# Patient Record
Sex: Female | Born: 1950 | Race: Black or African American | Hispanic: No | Marital: Married | Smoking: Former smoker
Health system: Southern US, Community
[De-identification: ages and names within clinical notes are randomized; demographics above are authoritative.]

## PROBLEM LIST (undated history)

## (undated) DIAGNOSIS — E039 Hypothyroidism, unspecified: Secondary | ICD-10-CM

## (undated) DIAGNOSIS — IMO0002 Reserved for concepts with insufficient information to code with codable children: Secondary | ICD-10-CM

## (undated) DIAGNOSIS — M329 Systemic lupus erythematosus, unspecified: Secondary | ICD-10-CM

## (undated) HISTORY — PX: OOPHORECTOMY: SHX86

## (undated) HISTORY — PX: PELVIC LAPAROSCOPY: SHX162

## (undated) HISTORY — PX: KNEE SURGERY: SHX244

## (undated) HISTORY — DX: Systemic lupus erythematosus, unspecified: M32.9

## (undated) HISTORY — DX: Hypothyroidism, unspecified: E03.9

## (undated) HISTORY — DX: Reserved for concepts with insufficient information to code with codable children: IMO0002

## (undated) HISTORY — PX: ABDOMINAL HYSTERECTOMY: SHX81

---

## 1999-01-28 ENCOUNTER — Encounter: Admission: RE | Admit: 1999-01-28 | Discharge: 1999-01-28 | Payer: Self-pay | Admitting: Gynecology

## 1999-01-28 ENCOUNTER — Encounter: Payer: Self-pay | Admitting: Gynecology

## 1999-02-04 ENCOUNTER — Encounter: Payer: Self-pay | Admitting: Gynecology

## 1999-02-04 ENCOUNTER — Encounter: Admission: RE | Admit: 1999-02-04 | Discharge: 1999-02-04 | Payer: Self-pay | Admitting: Gynecology

## 1999-02-28 ENCOUNTER — Other Ambulatory Visit: Admission: RE | Admit: 1999-02-28 | Discharge: 1999-02-28 | Payer: Self-pay | Admitting: Gynecology

## 1999-06-03 ENCOUNTER — Ambulatory Visit (HOSPITAL_COMMUNITY): Admission: RE | Admit: 1999-06-03 | Discharge: 1999-06-03 | Payer: Self-pay | Admitting: Internal Medicine

## 2000-02-05 ENCOUNTER — Encounter: Admission: RE | Admit: 2000-02-05 | Discharge: 2000-02-05 | Payer: Self-pay | Admitting: Gynecology

## 2000-02-05 ENCOUNTER — Encounter: Payer: Self-pay | Admitting: Gynecology

## 2000-02-05 ENCOUNTER — Other Ambulatory Visit: Admission: RE | Admit: 2000-02-05 | Discharge: 2000-02-05 | Payer: Self-pay | Admitting: Gynecology

## 2001-01-10 ENCOUNTER — Other Ambulatory Visit: Admission: RE | Admit: 2001-01-10 | Discharge: 2001-01-10 | Payer: Self-pay | Admitting: Gynecology

## 2001-02-09 ENCOUNTER — Encounter: Payer: Self-pay | Admitting: Gynecology

## 2001-02-09 ENCOUNTER — Encounter: Admission: RE | Admit: 2001-02-09 | Discharge: 2001-02-09 | Payer: Self-pay | Admitting: Gynecology

## 2002-01-11 ENCOUNTER — Other Ambulatory Visit: Admission: RE | Admit: 2002-01-11 | Discharge: 2002-01-11 | Payer: Self-pay | Admitting: Gynecology

## 2002-02-16 ENCOUNTER — Encounter: Payer: Self-pay | Admitting: Gynecology

## 2002-02-16 ENCOUNTER — Encounter: Admission: RE | Admit: 2002-02-16 | Discharge: 2002-02-16 | Payer: Self-pay | Admitting: Gynecology

## 2002-04-05 ENCOUNTER — Ambulatory Visit (HOSPITAL_COMMUNITY): Admission: RE | Admit: 2002-04-05 | Discharge: 2002-04-05 | Payer: Self-pay | Admitting: Internal Medicine

## 2002-09-23 ENCOUNTER — Emergency Department (HOSPITAL_COMMUNITY): Admission: EM | Admit: 2002-09-23 | Discharge: 2002-09-24 | Payer: Self-pay

## 2003-01-31 ENCOUNTER — Other Ambulatory Visit: Admission: RE | Admit: 2003-01-31 | Discharge: 2003-01-31 | Payer: Self-pay | Admitting: Gynecology

## 2003-02-07 ENCOUNTER — Encounter: Admission: RE | Admit: 2003-02-07 | Discharge: 2003-02-07 | Payer: Self-pay | Admitting: Gynecology

## 2003-06-14 ENCOUNTER — Encounter: Admission: RE | Admit: 2003-06-14 | Discharge: 2003-06-14 | Payer: Self-pay | Admitting: Internal Medicine

## 2004-02-06 ENCOUNTER — Other Ambulatory Visit: Admission: RE | Admit: 2004-02-06 | Discharge: 2004-02-06 | Payer: Self-pay | Admitting: Gynecology

## 2004-04-22 ENCOUNTER — Encounter: Admission: RE | Admit: 2004-04-22 | Discharge: 2004-04-22 | Payer: Self-pay | Admitting: Gynecology

## 2005-02-02 ENCOUNTER — Other Ambulatory Visit: Admission: RE | Admit: 2005-02-02 | Discharge: 2005-02-02 | Payer: Self-pay | Admitting: Gynecology

## 2005-04-24 ENCOUNTER — Encounter: Admission: RE | Admit: 2005-04-24 | Discharge: 2005-04-24 | Payer: Self-pay | Admitting: Gynecology

## 2005-08-28 ENCOUNTER — Encounter: Admission: RE | Admit: 2005-08-28 | Discharge: 2005-08-28 | Payer: Self-pay | Admitting: Internal Medicine

## 2006-02-16 ENCOUNTER — Other Ambulatory Visit: Admission: RE | Admit: 2006-02-16 | Discharge: 2006-02-16 | Payer: Self-pay | Admitting: Gynecology

## 2006-05-14 ENCOUNTER — Encounter: Admission: RE | Admit: 2006-05-14 | Discharge: 2006-05-14 | Payer: Self-pay | Admitting: Gynecology

## 2007-05-16 ENCOUNTER — Encounter: Admission: RE | Admit: 2007-05-16 | Discharge: 2007-05-16 | Payer: Self-pay | Admitting: Gynecology

## 2007-07-07 ENCOUNTER — Emergency Department (HOSPITAL_COMMUNITY): Admission: EM | Admit: 2007-07-07 | Discharge: 2007-07-08 | Payer: Self-pay | Admitting: Emergency Medicine

## 2007-07-11 ENCOUNTER — Ambulatory Visit (HOSPITAL_COMMUNITY): Admission: RE | Admit: 2007-07-11 | Discharge: 2007-07-11 | Payer: Self-pay | Admitting: Neurology

## 2007-08-19 ENCOUNTER — Emergency Department: Payer: Self-pay | Admitting: Emergency Medicine

## 2007-09-01 ENCOUNTER — Ambulatory Visit: Payer: Self-pay | Admitting: Specialist

## 2008-01-18 ENCOUNTER — Ambulatory Visit: Payer: Self-pay | Admitting: Specialist

## 2008-05-16 ENCOUNTER — Encounter: Admission: RE | Admit: 2008-05-16 | Discharge: 2008-05-16 | Payer: Self-pay | Admitting: Gynecology

## 2009-03-04 ENCOUNTER — Ambulatory Visit: Payer: Self-pay | Admitting: General Practice

## 2009-05-21 ENCOUNTER — Encounter: Admission: RE | Admit: 2009-05-21 | Discharge: 2009-05-21 | Payer: Self-pay | Admitting: Gynecology

## 2010-06-09 ENCOUNTER — Other Ambulatory Visit: Payer: Self-pay | Admitting: Gynecology

## 2010-06-09 DIAGNOSIS — Z1231 Encounter for screening mammogram for malignant neoplasm of breast: Secondary | ICD-10-CM

## 2010-06-17 ENCOUNTER — Ambulatory Visit: Payer: Self-pay

## 2010-07-08 ENCOUNTER — Ambulatory Visit
Admission: RE | Admit: 2010-07-08 | Discharge: 2010-07-08 | Disposition: A | Discharge status: Home / Self Care | Payer: 59 | Source: Ambulatory Visit | Attending: Gynecology | Admitting: Gynecology

## 2010-07-08 DIAGNOSIS — Z1231 Encounter for screening mammogram for malignant neoplasm of breast: Secondary | ICD-10-CM

## 2010-07-24 ENCOUNTER — Emergency Department (HOSPITAL_COMMUNITY)
Admission: EM | Admit: 2010-07-24 | Discharge: 2010-07-25 | Disposition: A | Discharge status: Home / Self Care | Payer: 59 | Attending: Emergency Medicine | Admitting: Emergency Medicine

## 2010-07-24 DIAGNOSIS — E039 Hypothyroidism, unspecified: Secondary | ICD-10-CM | POA: Insufficient documentation

## 2010-07-24 DIAGNOSIS — K089 Disorder of teeth and supporting structures, unspecified: Secondary | ICD-10-CM | POA: Insufficient documentation

## 2010-07-24 DIAGNOSIS — K047 Periapical abscess without sinus: Secondary | ICD-10-CM | POA: Insufficient documentation

## 2010-10-23 LAB — POCT I-STAT, CHEM 8
BUN: 37 — ABNORMAL HIGH
Calcium, Ion: 1.28
Chloride: 105
Creatinine, Ser: 1.4 — ABNORMAL HIGH
Hemoglobin: 14.6
Potassium: 4
TCO2: 26

## 2011-02-26 LAB — LIPID PANEL
HDL: 74 mg/dL — AB (ref 35–70)
LDL Cholesterol: 149 mg/dL
LDl/HDL Ratio: 3.1

## 2011-02-26 LAB — HEPATIC FUNCTION PANEL
AST: 96 U/L — AB (ref 13–35)
Bilirubin, Total: 0.4 mg/dL

## 2011-02-26 LAB — BASIC METABOLIC PANEL: Glucose: 75 mg/dL

## 2011-02-26 LAB — CBC AND DIFFERENTIAL
HCT: 37 % (ref 36–46)
Hemoglobin: 12 g/dL (ref 12.0–16.0)

## 2011-08-17 ENCOUNTER — Other Ambulatory Visit: Payer: Self-pay | Admitting: Gynecology

## 2011-08-17 DIAGNOSIS — Z1231 Encounter for screening mammogram for malignant neoplasm of breast: Secondary | ICD-10-CM

## 2011-08-21 ENCOUNTER — Ambulatory Visit
Admission: RE | Admit: 2011-08-21 | Discharge: 2011-08-21 | Disposition: A | Discharge status: Home / Self Care | Payer: 59 | Source: Ambulatory Visit | Attending: Internal Medicine | Admitting: Internal Medicine

## 2011-08-21 ENCOUNTER — Other Ambulatory Visit: Payer: Self-pay | Admitting: Internal Medicine

## 2011-08-21 DIAGNOSIS — R109 Unspecified abdominal pain: Secondary | ICD-10-CM

## 2011-09-01 ENCOUNTER — Ambulatory Visit: Payer: 59

## 2011-10-01 ENCOUNTER — Ambulatory Visit: Payer: 59

## 2011-10-20 ENCOUNTER — Ambulatory Visit
Admission: RE | Admit: 2011-10-20 | Discharge: 2011-10-20 | Disposition: A | Discharge status: Home / Self Care | Payer: 59 | Source: Ambulatory Visit | Attending: Gynecology | Admitting: Gynecology

## 2011-10-20 DIAGNOSIS — Z1231 Encounter for screening mammogram for malignant neoplasm of breast: Secondary | ICD-10-CM

## 2011-10-22 ENCOUNTER — Other Ambulatory Visit: Payer: Self-pay | Admitting: Gynecology

## 2011-10-22 DIAGNOSIS — R928 Other abnormal and inconclusive findings on diagnostic imaging of breast: Secondary | ICD-10-CM

## 2011-10-27 ENCOUNTER — Ambulatory Visit
Admission: RE | Admit: 2011-10-27 | Discharge: 2011-10-27 | Disposition: A | Discharge status: Home / Self Care | Payer: 59 | Source: Ambulatory Visit | Attending: Gynecology | Admitting: Gynecology

## 2011-10-27 DIAGNOSIS — R928 Other abnormal and inconclusive findings on diagnostic imaging of breast: Secondary | ICD-10-CM

## 2012-05-05 ENCOUNTER — Encounter: Payer: Self-pay | Admitting: General Practice

## 2012-08-11 ENCOUNTER — Encounter: Payer: Self-pay | Admitting: Gynecology

## 2012-08-11 ENCOUNTER — Ambulatory Visit (INDEPENDENT_AMBULATORY_CARE_PROVIDER_SITE_OTHER): Payer: 59 | Admitting: Gynecology

## 2012-08-11 VITALS — BP 112/64 | Ht 63.0 in | Wt 154.0 lb

## 2012-08-11 DIAGNOSIS — L989 Disorder of the skin and subcutaneous tissue, unspecified: Secondary | ICD-10-CM

## 2012-08-11 DIAGNOSIS — Z01419 Encounter for gynecological examination (general) (routine) without abnormal findings: Secondary | ICD-10-CM

## 2012-08-11 DIAGNOSIS — Z780 Asymptomatic menopausal state: Secondary | ICD-10-CM

## 2012-08-11 DIAGNOSIS — R1032 Left lower quadrant pain: Secondary | ICD-10-CM

## 2012-08-11 DIAGNOSIS — N951 Menopausal and female climacteric states: Secondary | ICD-10-CM

## 2012-08-11 MED ORDER — VALACYCLOVIR HCL 500 MG PO TABS: 500.0000 mg | ORAL_TABLET | Freq: Two times a day (BID) | ORAL | Status: DC

## 2012-08-11 NOTE — Progress Notes (Signed)
Isabel Lynn [birth year] [id number]        62 y.o.  [id number] for annual exam.  Former patient of Dr. Nicholas Lose with several issues noted below.  Past medical history,surgical history, medications, allergies, family history and social history were all reviewed and documented in the EPIC chart.  ROS:  Performed and pertinent positives and negatives are included in the history, assessment and plan .  Exam: Isabel Lynn assistant Filed Vitals:   08/11/12 1540  BP: 112/64  Height: 5\' 3"  (1.6 m)  Weight: 154 lb (69.854 kg)   General appearance  Normal Skin grossly crusting raised lesion left upper buttocks crease Head/Neck general alopecia with no cervical or supraclavicular adenopathy thyroid normal Lungs  clear Cardiac RR, without RMG Abdominal  soft, nontender, without masses, organomegaly. Left inguinal mild swelling with straining consistent with a small hernia. Breasts  examined lying and sitting without masses, retractions, discharge or axillary adenopathy. Pelvic  Ext/BUS/vagina  normal with atrophic changes   Adnexa  Without masses or tenderness    Anus and perineum  normal   Rectovaginal  normal sphincter tone without palpated masses or tenderness.    Assessment/Plan:  62 y.o. [id number] female for annual exam.   1. Menopausal symptoms. Patient had been on estrogen replacement therapy since her hysterectomy around age 62. She had a TAH/BSO for questionable reasons. Apparently she was having a lot of pain that ultimately led to her hysterectomy. It was not for abnormal bleeding or Pap smear abnormality issues.  She ran out several months ago and notes no real difference as far as hot flushes night sweats vaginal dryness or other symptoms. I reviewed the whole issue of HRT with her to include the WHI study with increased risk of stroke, heart attack, DVT and breast cancer. The ACOG and NAMS statements for lowest dose for the shortest period of time reviewed. Transdermal versus oral first-pass  effect benefit discussed.  She had been on the patch previously. At this point she's going to stay off of ERT and see how she does and as long as she continues well then she will stay off of hormone replacement. She started to develop worsening symptoms she'll represent for evaluation. 2. Suspected inguinal hernia. Complaining of intermittent left lower quadrant pain in the inguinal crease. On straining I can feel some bulging suspicious for hernia. She was a weight lifter which certainly would go along with a hernia formation. Recommend that she followup with Gen. surgery for evaluation and discussion about repair. It's unclear how much discomfort is really causing her and she may elect to monitor at present least she will hear what is involved with the surgery. 3. 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 Patient knows whenever she is stressed she gets a crusting lesion in the upper buttocks crease. She uses Neosporin and it usually resolves in 1 or so weeks. Exam today shows a crusting raised lesion suspicious for herpes. I did not do an HSV screening given her use of Neosporin recently. I did recommend a trial of Valtrex 500 mg twice a day for 10 days and then I asked her to come back in 2 weeks for reexamination. If there is any lingering raised area then I am going to biopsy this area. The importance of followup was stressed. 4. Mammography 09/2011. Patient had mammogram last September with a recommended additional views she never followed up for them. She is approaching a year mark now I recommended she call them and schedule a full mammography and  additional views as a pill appropriate but she really needs to followup for this and she agrees to do so. SBE monthly reviewed. 5. DEXA 15 years ago. I do not have a copy of this but she was not told it was abnormal. Recommend repeat now has baseline she agrees to arrange. Increase calcium vitamin D reviewed. 6. Colonoscopy 10 years ago. Due for colonoscopy  now she agrees to call her gastroenterologist to arrange. 7. Pap smear 2013. No Pap smear done today. No history of abnormal Pap smears previously. Status post hysterectomy for benign indications. Options to stop screening altogether or less frequent screening intervals reviewed and we'll readdress on an annual basis. 8. Health maintenance. Patient is bald and relates that it she had initial hair loss due to lupus and ultimately in shape the rest of her head. Apparently has no other manifestations by her history. She is being followed by Dr. Willey Blade and will continue to see him in followup. No blood work was done today as this is all done through Dr. Diamantina [facility] office. Followup in 2 weeks for reinspection of the buttocks area and possible biopsy if it persists. Again I stressed the need for followup with her.  Note: This document was prepared with digital dictation and possible smart phrase technology. Any transcriptional errors that result from this process are unintentional.   Dara Lords MD, 4:18 PM 08/11/2012

## 2012-08-11 NOTE — Patient Instructions (Addendum)
Followup for reexamination and possible skin biopsy in 2 weeks. Schedule bone density.

## 2012-08-12 ENCOUNTER — Telehealth: Payer: Self-pay | Admitting: *Deleted

## 2012-08-12 DIAGNOSIS — K4090 Unilateral inguinal hernia, without obstruction or gangrene, not specified as recurrent: Secondary | ICD-10-CM

## 2012-08-12 LAB — URINALYSIS W MICROSCOPIC + REFLEX CULTURE
Bilirubin Urine: NEGATIVE
Glucose, UA: NEGATIVE mg/dL
Protein, ur: NEGATIVE mg/dL
Specific Gravity, Urine: 1.021 (ref 1.005–1.030)
Urobilinogen, UA: 0.2 mg/dL (ref 0.0–1.0)

## 2012-08-12 NOTE — Telephone Encounter (Signed)
Appt. 08/23/12 @ 2:20 pm with Dr.Tusei left message for pt to call.

## 2012-08-12 NOTE — Telephone Encounter (Signed)
Message copied by Aura Camps on Fri Aug 12, 2012 11:23 AM ------      Message from: Dara Lords      Created: Thu Aug 11, 2012  5:08 PM       Schedule an appointment with Gen. surgery reference swelling and pain suspicious for left inguinal hernia. ------

## 2012-08-13 LAB — URINE CULTURE: Organism ID, Bacteria: NO GROWTH

## 2012-08-15 NOTE — Telephone Encounter (Signed)
Pt informed with the below note. 

## 2012-08-23 ENCOUNTER — Ambulatory Visit (INDEPENDENT_AMBULATORY_CARE_PROVIDER_SITE_OTHER): Payer: Self-pay | Admitting: Surgery

## 2012-08-25 ENCOUNTER — Ambulatory Visit: Payer: 59 | Admitting: Gynecology

## 2012-09-08 ENCOUNTER — Ambulatory Visit (INDEPENDENT_AMBULATORY_CARE_PROVIDER_SITE_OTHER): Payer: Self-pay | Admitting: Surgery

## 2012-09-08 ENCOUNTER — Ambulatory Visit (INDEPENDENT_AMBULATORY_CARE_PROVIDER_SITE_OTHER): Payer: 59 | Admitting: Surgery

## 2012-11-09 ENCOUNTER — Other Ambulatory Visit: Payer: Self-pay | Admitting: Gynecology

## 2012-11-09 DIAGNOSIS — N6001 Solitary cyst of right breast: Secondary | ICD-10-CM

## 2012-11-23 ENCOUNTER — Other Ambulatory Visit: Payer: 59

## 2012-12-07 ENCOUNTER — Ambulatory Visit
Admission: RE | Admit: 2012-12-07 | Discharge: 2012-12-07 | Disposition: A | Discharge status: Home / Self Care | Payer: 59 | Source: Ambulatory Visit | Attending: Gynecology | Admitting: Gynecology

## 2012-12-07 DIAGNOSIS — N6001 Solitary cyst of right breast: Secondary | ICD-10-CM

## 2013-08-24 ENCOUNTER — Ambulatory Visit (INDEPENDENT_AMBULATORY_CARE_PROVIDER_SITE_OTHER): Payer: 59 | Admitting: Gynecology

## 2013-08-24 ENCOUNTER — Encounter: Payer: Self-pay | Admitting: Gynecology

## 2013-08-24 VITALS — BP 122/76 | Ht 63.0 in | Wt 167.0 lb

## 2013-08-24 DIAGNOSIS — Z01419 Encounter for gynecological examination (general) (routine) without abnormal findings: Secondary | ICD-10-CM

## 2013-08-24 DIAGNOSIS — N952 Postmenopausal atrophic vaginitis: Secondary | ICD-10-CM

## 2013-08-24 DIAGNOSIS — Z780 Asymptomatic menopausal state: Secondary | ICD-10-CM

## 2013-08-24 NOTE — Patient Instructions (Signed)
Followup for bone density as scheduled.  Schedule colonoscopy.  You may obtain a copy of any labs that were done today by logging onto MyChart as outlined in the instructions provided with your AVS (after visit summary). The office will not call with normal lab results but certainly if there are any significant abnormalities then we will contact you.   Health Maintenance, Female A healthy lifestyle and preventative care can promote health and wellness.  Maintain regular health, dental, and eye exams.  Eat a healthy diet. Foods like vegetables, fruits, whole grains, low-fat dairy products, and lean protein foods contain the nutrients you need without too many calories. Decrease your intake of foods high in solid fats, added sugars, and salt. Get information about a proper diet from your caregiver, if necessary.  Regular physical exercise is one of the most important things you can do for your health. Most adults should get at least 150 minutes of moderate-intensity exercise (any activity that increases your heart rate and causes you to sweat) each week. In addition, most adults need muscle-strengthening exercises on 2 or more days a week.   Maintain a healthy weight. The body mass index (BMI) is a screening tool to identify possible weight problems. It provides an estimate of body fat based on height and weight. Your caregiver can help determine your BMI, and can help you achieve or maintain a healthy weight. For adults 20 years and older:  A BMI below 18.5 is considered underweight.  A BMI of 18.5 to 24.9 is normal.  A BMI of 25 to 29.9 is considered overweight.  A BMI of 30 and above is considered obese.  Maintain normal blood lipids and cholesterol by exercising and minimizing your intake of saturated fat. Eat a balanced diet with plenty of fruits and vegetables. Blood tests for lipids and cholesterol should begin at age 25 and be repeated every 5 years. If your lipid or cholesterol  levels are high, you are over 50, or you are a high risk for heart disease, you may need your cholesterol levels checked more frequently.Ongoing high lipid and cholesterol levels should be treated with medicines if diet and exercise are not effective.  If you smoke, find out from your caregiver how to quit. If you do not use tobacco, do not start.  Lung cancer screening is recommended for adults aged 71 80 years who are at high risk for developing lung cancer because of a history of smoking. Yearly low-dose computed tomography (CT) is recommended for people who have at least a 30-pack-year history of smoking and are a current smoker or have quit within the past 15 years. A pack year of smoking is smoking an average of 1 pack of cigarettes a day for 1 year (for example: 1 pack a day for 30 years or 2 packs a day for 15 years). Yearly screening should continue until the smoker has stopped smoking for at least 15 years. Yearly screening should also be stopped for people who develop a health problem that would prevent them from having lung cancer treatment.  If you are pregnant, do not drink alcohol. If you are breastfeeding, be very cautious about drinking alcohol. If you are not pregnant and choose to drink alcohol, do not exceed 1 drink per day. One drink is considered to be 12 ounces (355 mL) of beer, 5 ounces (148 mL) of wine, or 1.5 ounces (44 mL) of liquor.  Avoid use of street drugs. Do not share needles with anyone. Ask  for help if you need support or instructions about stopping the use of drugs.  High blood pressure causes heart disease and increases the risk of stroke. Blood pressure should be checked at least every 1 to 2 years. Ongoing high blood pressure should be treated with medicines, if weight loss and exercise are not effective.  If you are 43 to 61 years old, ask your caregiver if you should take aspirin to prevent strokes.  Diabetes screening involves taking a blood sample to check  your fasting blood sugar level. This should be done once every 3 years, after age 55, if you are within normal weight and without risk factors for diabetes. Testing should be considered at a younger age or be carried out more frequently if you are overweight and have at least 1 risk factor for diabetes.  Breast cancer screening is essential preventative care for women. You should practice "breast self-awareness." This means understanding the normal appearance and feel of your breasts and may include breast self-examination. Any changes detected, no matter how small, should be reported to a caregiver. Women in their 21s and 30s should have a clinical breast exam (CBE) by a caregiver as part of a regular health exam every 1 to 3 years. After age 73, women should have a CBE every year. Starting at age 49, women should consider having a mammogram (breast X-ray) every year. Women who have a family history of breast cancer should talk to their caregiver about genetic screening. Women at a high risk of breast cancer should talk to their caregiver about having an MRI and a mammogram every year.  Breast cancer gene (BRCA)-related cancer risk assessment is recommended for women who have family members with BRCA-related cancers. BRCA-related cancers include breast, ovarian, tubal, and peritoneal cancers. Having family members with these cancers may be associated with an increased risk for harmful changes (mutations) in the breast cancer genes BRCA1 and BRCA2. Results of the assessment will determine the need for genetic counseling and BRCA1 and BRCA2 testing.  The Pap test is a screening test for cervical cancer. Women should have a Pap test starting at age 1. Between ages 21 and 4, Pap tests should be repeated every 2 years. Beginning at age 66, you should have a Pap test every 3 years as long as the past 3 Pap tests have been normal. If you had a hysterectomy for a problem that was not cancer or a condition that  could lead to cancer, then you no longer need Pap tests. If you are between ages 56 and 37, and you have had normal Pap tests going back 10 years, you no longer need Pap tests. If you have had past treatment for cervical cancer or a condition that could lead to cancer, you need Pap tests and screening for cancer for at least 20 years after your treatment. If Pap tests have been discontinued, risk factors (such as a new sexual partner) need to be reassessed to determine if screening should be resumed. Some women have medical problems that increase the chance of getting cervical cancer. In these cases, your caregiver may recommend more frequent screening and Pap tests.  The human papillomavirus (HPV) test is an additional test that may be used for cervical cancer screening. The HPV test looks for the virus that can cause the cell changes on the cervix. The cells collected during the Pap test can be tested for HPV. The HPV test could be used to screen women aged 36 years and  older, and should be used in women of any age who have unclear Pap test results. After the age of 59, women should have HPV testing at the same frequency as a Pap test.  Colorectal cancer can be detected and often prevented. Most routine colorectal cancer screening begins at the age of 52 and continues through age 54. However, your caregiver may recommend screening at an earlier age if you have risk factors for colon cancer. On a yearly basis, your caregiver may provide home test kits to check for hidden blood in the stool. Use of a small camera at the end of a tube, to directly examine the colon (sigmoidoscopy or colonoscopy), can detect the earliest forms of colorectal cancer. Talk to your caregiver about this at age 26, when routine screening begins. Direct examination of the colon should be repeated every 5 to 10 years through age 35, unless early forms of pre-cancerous polyps or small growths are found.  Hepatitis C blood testing is  recommended for all people born from [birth year] through 1963 and any individual with known risks for hepatitis C.  Practice safe sex. Use condoms and avoid high-risk sexual practices to reduce the spread of sexually transmitted infections (STIs). Sexually active women aged 93 and younger should be checked for Chlamydia, which is a common sexually transmitted infection. Older women with new or multiple partners should also be tested for Chlamydia. Testing for other STIs is recommended if you are sexually active and at increased risk.  Osteoporosis is a disease in which the bones lose minerals and strength with aging. This can result in serious bone fractures. The risk of osteoporosis can be identified using a bone density scan. Women ages 36 and over and women at risk for fractures or osteoporosis should discuss screening with their caregivers. Ask your caregiver whether you should be taking a calcium supplement or vitamin D to reduce the rate of osteoporosis.  Menopause can be associated with physical symptoms and risks. Hormone replacement therapy is available to decrease symptoms and risks. You should talk to your caregiver about whether hormone replacement therapy is right for you.  Use sunscreen. Apply sunscreen liberally and repeatedly throughout the day. You should seek shade when your shadow is shorter than you. Protect yourself by wearing long sleeves, pants, a wide-brimmed hat, and sunglasses year round, whenever you are outdoors.  Notify your caregiver of new moles or changes in moles, especially if there is a change in shape or color. Also notify your caregiver if a mole is larger than the size of a pencil eraser.  Stay current with your immunizations. Document Released: 07/28/2010 Document Revised: 05/09/2012 Document Reviewed: 07/28/2010 [facility] Patient Information 2014 [place].

## 2013-08-24 NOTE — Progress Notes (Signed)
Isabel Lynn 04/17/1950 [id number]        63 y.o.  [id number] for annual exam.  Several issues noted below.  Past medical history,surgical history, problem list, medications, allergies, family history and social history were all reviewed and documented as reviewed in the EPIC chart.  ROS:  12 system ROS performed with pertinent positives and negatives included in the history, assessment and plan.   Additional significant findings :  None   Exam: Kim Counsellor Vitals:   08/24/13 1536  BP: 122/76  Height: 5\' 3"  (1.6 m)  Weight: 167 lb (75.751 kg)   General appearance:  Normal affect, orientation and appearance. Skin: Grossly normal HEENT: Without gross lesions.  No cervical or supraclavicular adenopathy. Thyroid normal.  Lungs:  Clear without wheezing, rales or rhonchi Cardiac: RR, without RMG Abdominal:  Soft, nontender, without masses, guarding, rebound, organomegaly or hernia Breasts:  Examined lying and sitting without masses, retractions, discharge or axillary adenopathy. Pelvic:  Ext/BUS/vagina with generalized atrophic changes  Adnexa  Without masses or tenderness    Anus and perineum  Normal   Rectovaginal  Normal sphincter tone without palpated masses or tenderness.    Assessment/Plan:  63 y.o. [id number] female for annual exam.   1. Postmenopausal/atrophic genital changes.  Status post TAH/BSO in the past secondary to pain. Patient still having some hot flushes and sweats. Had previously been on estrogen patch. I reviewed with her last year the risks benefits of HRT as outlined in my 08/11/2012 note. I again reviewed them with her today. She is not interested in HRT at this point but will followup if she wants to rediscuss this. 2. Prior suspected inguinal hernia on the left. Patient never followed up with general surgeon as offered last year. She notes now it seems more intermittent and not bothersome to her. Declines any further evaluation at this point. Exam today  shows no evidence of hernia or masses or other abnormalities. 3. 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 I discussed Valtrex last year but she never really used this. She prefers Neosporin ointment when it occurs and this seems to resolve it quickly. Exam today shows no evidence of lesions. 4. DEXA reported 15 years ago. I do not have a copy of this. I did recommend getting a baseline now and she is off of HRT does have a history of lupus and hypothyroidism. Patient will schedule a followup for this. Increase calcium vitamin D reviewed. 5. Colonoscopy 11 years ago. Patient knows she is overdue and agrees to schedule this. Names and numbers provided for her to call. 6. Pap smear 2013. No Pap smear done today. No history of abnormal Pap smears previously. Status post hysterectomy for benign indication. Options to stop screening versus repeat next year at three-year interval reviewed. We'll readdress next year. 7. Mammography 11/2012. Reminded her to schedule it this coming fall. SBE monthly reviewed. 8. Health maintenance. Patient has all of her routine lab work done through Dr. Randall Hiss Dean's office. Followup in one year, sooner as needed.   Note: This document was prepared with digital dictation and possible smart phrase technology. Any transcriptional errors that result from this process are unintentional.   Anastasio Auerbach MD, 4:14 PM 08/24/2013

## 2013-08-26 LAB — URINALYSIS W MICROSCOPIC + REFLEX CULTURE
Bacteria, UA: NONE SEEN
Bilirubin Urine: NEGATIVE
CASTS: NONE SEEN
CRYSTALS: NONE SEEN
GLUCOSE, UA: NEGATIVE mg/dL
Hgb urine dipstick: NEGATIVE
Ketones, ur: NEGATIVE mg/dL
LEUKOCYTES UA: NEGATIVE
NITRITE: NEGATIVE
PH: 5.5 (ref 5.0–8.0)
Protein, ur: NEGATIVE mg/dL
SPECIFIC GRAVITY, URINE: 1.018 (ref 1.005–1.030)
SQUAMOUS EPITHELIAL / LPF: NONE SEEN
Urobilinogen, UA: 0.2 mg/dL (ref 0.0–1.0)

## 2013-10-30 ENCOUNTER — Other Ambulatory Visit: Payer: Self-pay | Admitting: Gynecology

## 2013-10-30 ENCOUNTER — Ambulatory Visit (INDEPENDENT_AMBULATORY_CARE_PROVIDER_SITE_OTHER): Payer: 59

## 2013-10-30 DIAGNOSIS — Z780 Asymptomatic menopausal state: Secondary | ICD-10-CM

## 2013-10-30 DIAGNOSIS — Z13820 Encounter for screening for osteoporosis: Secondary | ICD-10-CM

## 2013-11-24 ENCOUNTER — Encounter: Payer: Self-pay | Admitting: Gynecology

## 2013-11-27 ENCOUNTER — Encounter: Payer: Self-pay | Admitting: Gynecology

## 2014-04-05 ENCOUNTER — Other Ambulatory Visit: Payer: Self-pay | Admitting: Gynecology

## 2014-04-05 DIAGNOSIS — N6001 Solitary cyst of right breast: Secondary | ICD-10-CM

## 2014-04-10 ENCOUNTER — Other Ambulatory Visit: Payer: Self-pay | Admitting: Gynecology

## 2014-04-10 ENCOUNTER — Other Ambulatory Visit: Payer: Self-pay

## 2014-04-10 DIAGNOSIS — N6001 Solitary cyst of right breast: Secondary | ICD-10-CM

## 2014-04-11 ENCOUNTER — Ambulatory Visit
Admission: RE | Admit: 2014-04-11 | Discharge: 2014-04-11 | Disposition: A | Discharge status: Home / Self Care | Payer: 59 | Source: Ambulatory Visit | Attending: Gynecology | Admitting: Gynecology

## 2014-04-11 DIAGNOSIS — N6001 Solitary cyst of right breast: Secondary | ICD-10-CM

## 2014-05-04 ENCOUNTER — Emergency Department (HOSPITAL_COMMUNITY)
Admission: EM | Admit: 2014-05-04 | Discharge: 2014-05-04 | Disposition: A | Discharge status: Home / Self Care | Payer: 59 | Attending: Emergency Medicine | Admitting: Emergency Medicine

## 2014-05-04 ENCOUNTER — Encounter (HOSPITAL_COMMUNITY): Payer: Self-pay | Admitting: Emergency Medicine

## 2014-05-04 DIAGNOSIS — N390 Urinary tract infection, site not specified: Secondary | ICD-10-CM | POA: Insufficient documentation

## 2014-05-04 DIAGNOSIS — R319 Hematuria, unspecified: Secondary | ICD-10-CM | POA: Diagnosis present

## 2014-05-04 DIAGNOSIS — E039 Hypothyroidism, unspecified: Secondary | ICD-10-CM | POA: Diagnosis not present

## 2014-05-04 DIAGNOSIS — Z8739 Personal history of other diseases of the musculoskeletal system and connective tissue: Secondary | ICD-10-CM | POA: Insufficient documentation

## 2014-05-04 DIAGNOSIS — Z87891 Personal history of nicotine dependence: Secondary | ICD-10-CM | POA: Insufficient documentation

## 2014-05-04 LAB — URINALYSIS, ROUTINE W REFLEX MICROSCOPIC
Glucose, UA: NEGATIVE mg/dL
Ketones, ur: 40 mg/dL — AB
Nitrite: POSITIVE — AB
PH: 5.5 (ref 5.0–8.0)
Protein, ur: 100 mg/dL — AB
SPECIFIC GRAVITY, URINE: 1.022 (ref 1.005–1.030)
Urobilinogen, UA: 1 mg/dL (ref 0.0–1.0)

## 2014-05-04 LAB — COMPREHENSIVE METABOLIC PANEL
ALT: 19 U/L (ref 0–35)
AST: 29 U/L (ref 0–37)
Albumin: 4.2 g/dL (ref 3.5–5.2)
Alkaline Phosphatase: 54 U/L (ref 39–117)
Anion gap: 4 — ABNORMAL LOW (ref 5–15)
BUN: 15 mg/dL (ref 6–23)
CO2: 29 mmol/L (ref 19–32)
Calcium: 9.6 mg/dL (ref 8.4–10.5)
Chloride: 109 mmol/L (ref 96–112)
Creatinine, Ser: 1.22 mg/dL — ABNORMAL HIGH (ref 0.50–1.10)
GFR calc Af Amer: 54 mL/min — ABNORMAL LOW (ref >90)
GFR calc non Af Amer: 47 mL/min — ABNORMAL LOW (ref >90)
Glucose, Bld: 87 mg/dL (ref 70–99)
Potassium: 3.9 mmol/L (ref 3.5–5.1)
Sodium: 142 mmol/L (ref 135–145)
Total Bilirubin: 0.5 mg/dL (ref 0.3–1.2)
Total Protein: 7 g/dL (ref 6.0–8.3)

## 2014-05-04 LAB — CBC WITH DIFFERENTIAL/PLATELET
Basophils Absolute: 0 10*3/uL (ref 0.0–0.1)
Basophils Relative: 1 % (ref 0–1)
Eosinophils Absolute: 0.2 10*3/uL (ref 0.0–0.7)
Eosinophils Relative: 3 % (ref 0–5)
HCT: 39.4 % (ref 36.0–46.0)
Hemoglobin: 12.8 g/dL (ref 12.0–15.0)
Lymphocytes Relative: 37 % (ref 12–46)
Lymphs Abs: 2.8 10*3/uL (ref 0.7–4.0)
MCH: 28.2 pg (ref 26.0–34.0)
MCHC: 32.5 g/dL (ref 30.0–36.0)
MCV: 86.8 fL (ref 78.0–100.0)
Monocytes Absolute: 0.4 10*3/uL (ref 0.1–1.0)
Monocytes Relative: 5 % (ref 3–12)
Neutro Abs: 4.1 10*3/uL (ref 1.7–7.7)
Neutrophils Relative %: 54 % (ref 43–77)
Platelets: 218 10*3/uL (ref 150–400)
RBC: 4.54 MIL/uL (ref 3.87–5.11)
RDW: 13.1 % (ref 11.5–15.5)
WBC: 7.6 10*3/uL (ref 4.0–10.5)

## 2014-05-04 LAB — URINE MICROSCOPIC-ADD ON

## 2014-05-04 MED ORDER — LIDOCAINE HCL (PF) 1 % IJ SOLN
INTRAMUSCULAR | Status: AC
  Filled 2014-05-04: qty 5

## 2014-05-04 MED ORDER — HYDROCODONE-ACETAMINOPHEN 5-325 MG PO TABS
1.0000 | ORAL_TABLET | Freq: Once | ORAL | Status: AC
  Administered 2014-05-04: 1 via ORAL
  Filled 2014-05-04: qty 1

## 2014-05-04 MED ORDER — CEPHALEXIN 500 MG PO CAPS: 500.0000 mg | ORAL_CAPSULE | Freq: Four times a day (QID) | ORAL | Status: DC

## 2014-05-04 MED ORDER — HYDROCODONE-ACETAMINOPHEN 5-325 MG PO TABS: 1.0000 | ORAL_TABLET | ORAL | Status: DC | PRN

## 2014-05-04 MED ORDER — CEFTRIAXONE SODIUM 1 G IJ SOLR
1.0000 g | Freq: Once | INTRAMUSCULAR | Status: AC
  Administered 2014-05-04: 1 g via INTRAMUSCULAR
  Filled 2014-05-04: qty 10

## 2014-05-04 NOTE — Discharge Instructions (Signed)

## 2014-05-04 NOTE — ED Notes (Addendum)
The patient said she has had blood in her urine today.  Yesterday she woke up feeling pressure and fullness in her lower abdomen.  She says her pain is so severe she is afraid to use the restroom.  She has tried to drink water to "flush" it out but it has gotten worse.

## 2014-05-04 NOTE — ED Provider Notes (Signed)
CSN: [id number]     Arrival date & time 05/04/14  2112 History   First MD Initiated Contact with Patient 05/04/14 2226     Chief Complaint  Patient presents with  . Hematuria    The patient said she has had blood in her urine today.  Yesterday she woke up feeling pressure and fullness in her lower abdomen.       (Consider location/radiation/quality/duration/timing/severity/associated sxs/prior Treatment) HPI Comments: Patient presents to the ER for evaluation of dysuria and hematuria. Patient reports that she had heaviness and fullness in the area of her bladder when she urinated yesterday. She reports that she drank a lot of water yesterday and symptoms improved. This afternoon and evening, however, she has had multiple episodes of painful urination followed by onset of hematuria. She has not had any fever, nausea or vomiting.  Patient is a 64 y.o. female presenting with hematuria.  Hematuria    Past Medical History  Diagnosis Date  . Hypothyroidism   . Lupus    Past Surgical History  Procedure Laterality Date  . Knee surgery    . Pelvic laparoscopy    . Oophorectomy      BSO  . Abdominal hysterectomy  age 69    BSO   Family History  Problem Relation Age of Onset  . Deep vein thrombosis Mother   . Diabetes Sister   . Kidney disease Sister    History  Substance Use Topics  . Smoking status: Former Research scientist (life sciences)  . Smokeless tobacco: Not on file  . Alcohol Use: Yes     Comment: Rare   OB History    Gravida Para Term Preterm AB TAB SAB Ectopic Multiple Living   3 2 2  1  1   2      Review of Systems  Genitourinary: Positive for dysuria and hematuria.  All other systems reviewed and are negative.     Allergies  Review of patient's allergies indicates no known allergies.  Home Medications   Prior to Admission medications   Medication Sig Start Date End Date Taking? Authorizing Provider  levothyroxine (SYNTHROID, LEVOTHROID) 50 MCG tablet Take 50 mcg by mouth every  morning.   Yes Historical Provider, MD  cephALEXin (KEFLEX) 500 MG capsule Take 1 capsule (500 mg total) by mouth 4 (four) times daily. 05/04/14   Orpah Greek, MD  HYDROcodone-acetaminophen (NORCO/VICODIN) 5-325 MG per tablet Take 1 tablet by mouth every 4 (four) hours as needed for moderate pain. 05/04/14   Orpah Greek, MD   BP 124/59 mmHg  Pulse 49  Temp(Src) 98.5 F (36.9 C) (Oral)  Resp 18  Ht 5\' 3"  (1.6 m)  Wt 160 lb (72.576 kg)  BMI 28.35 kg/m2  SpO2 100% Physical Exam  Constitutional: She is oriented to person, place, and time. She appears well-developed and well-nourished. No distress.  HENT:  Head: Normocephalic and atraumatic.  Right Ear: Hearing normal.  Left Ear: Hearing normal.  Nose: Nose normal.  Mouth/Throat: Oropharynx is clear and moist and mucous membranes are normal.  Eyes: Conjunctivae and EOM are normal. Pupils are equal, round, and reactive to light.  Neck: Normal range of motion. Neck supple.  Cardiovascular: Regular rhythm, S1 normal and S2 normal.  Exam reveals no gallop and no friction rub.   No murmur heard. Pulmonary/Chest: Effort normal and breath sounds normal. No respiratory distress. She exhibits no tenderness.  Abdominal: Soft. Normal appearance and bowel sounds are normal. There is no hepatosplenomegaly. There is tenderness in  the suprapubic area. There is no rebound, no guarding, no tenderness at McBurney's point and negative Murphy's sign. No hernia.  Musculoskeletal: Normal range of motion.  Neurological: She is alert and oriented to person, place, and time. She has normal strength. No cranial nerve deficit or sensory deficit. Coordination normal. GCS eye subscore is 4. GCS verbal subscore is 5. GCS motor subscore is 6.  Skin: Skin is warm, dry and intact. No rash noted. No cyanosis.  Psychiatric: She has a normal mood and affect. Her speech is normal and behavior is normal. Thought content normal.  Nursing note and vitals  reviewed.   ED Course  Procedures (including critical care time) Labs Review Labs Reviewed  COMPREHENSIVE METABOLIC PANEL - Abnormal; Notable for the following:    Creatinine, Ser 1.22 (*)    GFR calc non Af Amer 47 (*)    GFR calc Af Amer 54 (*)    Anion gap 4 (*)    All other components within normal limits  URINALYSIS, ROUTINE W REFLEX MICROSCOPIC - Abnormal; Notable for the following:    Color, Urine RED (*)    APPearance TURBID (*)    Hgb urine dipstick LARGE (*)    Bilirubin Urine LARGE (*)    Ketones, ur 40 (*)    Protein, ur 100 (*)    Nitrite POSITIVE (*)    Leukocytes, UA LARGE (*)    All other components within normal limits  CBC WITH DIFFERENTIAL/PLATELET  URINE MICROSCOPIC-ADD ON    Imaging Review No results found.   EKG Interpretation None      MDM   Final diagnoses:  UTI (lower urinary tract infection)    Patient presents to the ER for evaluation of dysuria and hematuria. Patient has been experiencing a fullness in the area of the bladder since yesterday. She has not had any vaginal or rectal bleeding. Patient reports worsening symptoms of pain with urination tonight with the onset of the hematuria. She does not have any CVA tenderness. She appears well. Vital signs are unremarkable. Urinalysis suggest infection. Patient will be treated with Cipro) antibiotic therapy and follow-up with primary doctor.    Orpah Greek, MD 05/04/14 [phone]

## 2015-05-27 ENCOUNTER — Other Ambulatory Visit: Payer: Self-pay

## 2015-05-27 DIAGNOSIS — Z1231 Encounter for screening mammogram for malignant neoplasm of breast: Secondary | ICD-10-CM

## 2015-06-11 ENCOUNTER — Ambulatory Visit: Payer: 59

## 2015-06-26 ENCOUNTER — Ambulatory Visit
Admission: RE | Admit: 2015-06-26 | Discharge: 2015-06-26 | Disposition: A | Discharge status: Home / Self Care | Payer: 59 | Source: Ambulatory Visit

## 2015-06-26 DIAGNOSIS — Z1231 Encounter for screening mammogram for malignant neoplasm of breast: Secondary | ICD-10-CM

## 2016-11-09 ENCOUNTER — Other Ambulatory Visit: Payer: Self-pay | Admitting: Gynecology

## 2016-11-09 DIAGNOSIS — Z1231 Encounter for screening mammogram for malignant neoplasm of breast: Secondary | ICD-10-CM

## 2016-11-27 ENCOUNTER — Ambulatory Visit
Admission: RE | Admit: 2016-11-27 | Discharge: 2016-11-27 | Disposition: A | Discharge status: Home / Self Care | Payer: 59 | Source: Ambulatory Visit | Attending: Gynecology | Admitting: Gynecology

## 2016-11-27 DIAGNOSIS — Z1231 Encounter for screening mammogram for malignant neoplasm of breast: Secondary | ICD-10-CM

## 2017-11-15 ENCOUNTER — Encounter: Payer: Self-pay | Admitting: Gynecology

## 2017-11-15 ENCOUNTER — Ambulatory Visit: Payer: Medicare Other | Admitting: Gynecology

## 2017-11-15 VITALS — BP 118/76 | Ht 63.0 in | Wt 146.0 lb

## 2017-11-15 DIAGNOSIS — Z01419 Encounter for gynecological examination (general) (routine) without abnormal findings: Secondary | ICD-10-CM

## 2017-11-15 DIAGNOSIS — N951 Menopausal and female climacteric states: Secondary | ICD-10-CM

## 2017-11-15 NOTE — Addendum Note (Signed)
Addended by: Nelva Nay on: 11/15/2017 12:36 PM   Modules accepted: Orders

## 2017-11-15 NOTE — Patient Instructions (Signed)
By the over-the-counter soy-based products such as Estroven to see if it does not help with hot flushes.  Call if you want to consider going back on estrogen.

## 2017-11-15 NOTE — Progress Notes (Signed)
    Cathyrn Deas 02/24/1950 [id number]        67 y.o.  [id number] for annual gynecologic exam.  Has not been in the office since 2015.  Still having issues with hot flushes and sweats.  Had been on ERT in the past but discontinued.  Status post TAH/BSO due to pain.  Past medical history,surgical history, problem list, medications, allergies, family history and social history were all reviewed and documented as reviewed in the EPIC chart.  ROS:  Performed with pertinent positives and negatives included in the history, assessment and plan.   Additional significant findings : None   Exam: Caryn Bee assistant Vitals:   11/15/17 1207  BP: 118/76  Weight: 146 lb (66.2 kg)  Height: 5\' 3"  (1.6 m)   Body mass index is 25.86 kg/m.  General appearance:  Normal affect, orientation and appearance. Skin: Grossly normal HEENT: Without gross lesions.  No cervical or supraclavicular adenopathy. Thyroid normal.  Lungs:  Clear without wheezing, rales or rhonchi Cardiac: RR, without RMG Abdominal:  Soft, nontender, without masses, guarding, rebound, organomegaly or hernia Breasts:  Examined lying and sitting without masses, retractions, discharge or axillary adenopathy. Pelvic:  Ext, BUS, Vagina: With atrophic changes.  Pap smear of cuff done  Adnexa: Without masses or tenderness    Anus and perineum: Normal   Rectovaginal: Normal sphincter tone without palpated masses or tenderness.    Assessment/Plan:  67 y.o. [id number] female for annual gynecologic exam status post TAH/BSO.   1. Postmenopausal/menopausal symptoms.  Status post TAH/BSO in the past.  Had been on ERT but discontinued.  Notes her hot flashes seem to be getting worse.  We discussed options to include observation, OTC products such as Estroven, reinitiating ERT.  Risks versus benefits were reviewed to include increased risk of thrombosis such as stroke heart attack DVT as well as the breast cancer issue.  At this point the  patient does not want to start estrogen but will try OTC products.  Will call if she wants to reconsider starting estrogen. 2. Pap smear 2013.  Pap smear done today.  No history of significant abnormal Pap smears.  Options to stop screening per current screening guidelines based on hysterectomy history and age discussed.  Will reassess on an annual basis. 3. Mammography coming due in November and I reminded her to schedule this.  Breast exam normal today. 4. Colonoscopy 2015.  Repeat at their recommended interval. 5. DEXA 2015 normal.  Recommend repeat next year at 5-year interval. 6. Health maintenance.  No routine lab work done as patient does this elsewhere.  Follow-up 1 year, sooner as needed.   Anastasio Auerbach MD, 12:30 PM 11/15/2017

## 2017-11-17 LAB — PAP IG W/ RFLX HPV ASCU

## 2017-11-29 ENCOUNTER — Other Ambulatory Visit: Payer: Self-pay | Admitting: Gynecology

## 2017-11-29 DIAGNOSIS — Z1231 Encounter for screening mammogram for malignant neoplasm of breast: Secondary | ICD-10-CM

## 2018-01-12 ENCOUNTER — Ambulatory Visit
Admission: RE | Admit: 2018-01-12 | Discharge: 2018-01-12 | Disposition: A | Discharge status: Home / Self Care | Payer: 59 | Source: Ambulatory Visit | Attending: Gynecology | Admitting: Gynecology

## 2018-01-12 DIAGNOSIS — Z1231 Encounter for screening mammogram for malignant neoplasm of breast: Secondary | ICD-10-CM

## 2018-06-23 ENCOUNTER — Ambulatory Visit
Admission: RE | Admit: 2018-06-23 | Discharge: 2018-06-23 | Disposition: A | Discharge status: Home / Self Care | Payer: Medicare Other | Source: Ambulatory Visit | Attending: Internal Medicine | Admitting: Internal Medicine

## 2018-06-23 ENCOUNTER — Other Ambulatory Visit: Payer: Self-pay

## 2018-06-23 ENCOUNTER — Other Ambulatory Visit: Payer: Self-pay | Admitting: Internal Medicine

## 2018-06-23 DIAGNOSIS — R079 Chest pain, unspecified: Secondary | ICD-10-CM

## 2018-11-02 ENCOUNTER — Encounter: Payer: Self-pay | Admitting: Gynecology

## 2018-12-01 ENCOUNTER — Other Ambulatory Visit: Payer: Self-pay

## 2018-12-01 ENCOUNTER — Encounter: Payer: Self-pay | Admitting: Surgery

## 2018-12-01 ENCOUNTER — Ambulatory Visit (INDEPENDENT_AMBULATORY_CARE_PROVIDER_SITE_OTHER): Payer: Medicare Other | Admitting: Surgery

## 2018-12-01 ENCOUNTER — Ambulatory Visit: Payer: Self-pay

## 2018-12-01 DIAGNOSIS — G8929 Other chronic pain: Secondary | ICD-10-CM

## 2018-12-01 DIAGNOSIS — M7542 Impingement syndrome of left shoulder: Secondary | ICD-10-CM | POA: Diagnosis not present

## 2018-12-01 DIAGNOSIS — M25512 Pain in left shoulder: Secondary | ICD-10-CM

## 2018-12-01 MED ORDER — METHYLPREDNISOLONE ACETATE 40 MG/ML IJ SUSP
40.0000 mg | INTRAMUSCULAR | Status: AC | PRN
  Administered 2018-12-01: 40 mg via INTRA_ARTICULAR

## 2018-12-01 MED ORDER — BUPIVACAINE HCL 0.25 % IJ SOLN
4.0000 mL | INTRAMUSCULAR | Status: AC | PRN
  Administered 2018-12-01: 4 mL via INTRA_ARTICULAR

## 2018-12-01 MED ORDER — LIDOCAINE HCL 1 % IJ SOLN
3.0000 mL | INTRAMUSCULAR | Status: AC | PRN
  Administered 2018-12-01: 3 mL

## 2018-12-01 NOTE — Progress Notes (Signed)
Office Visit Note   Patient: Isabel Lynn           Date of Birth: 02-14-1950           MRN: [id number] Visit Date: 12/01/2018              Requested by: Rogers Blocker, MD [address] [place],  [place] [address] PCP: Rogers Blocker, MD   Assessment & Plan: Visit Diagnoses:  1. Chronic left shoulder pain   2. Impingement syndrome of left shoulder     Plan: In hopes of giving patient some relief of her shoulder pain offered Marcaine/Depo-Medrol injection.  After patient consent left shoulder was prepped and subacromial injection performed.  Tolerate injection without complication.  Will take it easy over the next couple of days and then can resume working out again.  Follow-up me in 4 weeks for recheck.  If she does not have any improvement I may consider getting an MRI left shoulder at that time.  Follow-Up Instructions: Return in about 4 weeks (around 12/29/2018) for With [facility] recheck left shoulder.   Orders:  Orders Placed This Encounter  Procedures  . XR Shoulder Left   No orders of the defined types were placed in this encounter.     Procedures: Large Joint Inj: L subacromial bursa on 12/01/2018 2:32 PM Indications: pain Details: 25 G 1.5 in needle, posterior approach Medications: 3 mL lidocaine 1 %; 4 mL bupivacaine 0.25 %; 40 mg methylPREDNISolone acetate 40 MG/ML Outcome: tolerated well, no immediate complications Consent was given by the patient. Patient was prepped and draped in the usual sterile fashion.       Clinical Data: No additional findings.   Subjective: Chief Complaint  Patient presents with  . Left Shoulder - Pain    HPI 68 year old white female comes in today complaints of left shoulder pain.  Patient states that about a year ago she was lifting weights and she heard a pop in her shoulder.  Shoulder did improve but over the last year continue to have some aches and pains here and there but nothing that limited her activity.  Few weeks  ago she had increased shoulder pain that is aggravated with overhead activity and reaching on her back.  Pain also at night when she lays directly onto her left shoulder.  No cervical spinal radicular component.  She has not tried taking any oral NSAIDs.  Review of Systems No current cardiac pulmonary GI GU issues  Objective: Vital Signs: There were no vitals taken for this visit.  Physical Exam Constitutional:      Appearance: Normal appearance.  HENT:     Head: Normocephalic and atraumatic.  Eyes:     Extraocular Movements: Extraocular movements intact.     Pupils: Pupils are equal, round, and reactive to light.  Pulmonary:     Effort: No respiratory distress.  Musculoskeletal:     Comments: Cervical spine unremarkable.  Left shoulder she has good range of motion but with discomfort.  Positive impingement test.  Negative drop arm test.  Pain with supraspinatus resistance.  No focal motor deficits.  Neurovascular intact.  Skin:    General: Skin is warm and dry.  Neurological:     General: No focal deficit present.     Mental Status: She is alert and oriented to person, place, and time.  Psychiatric:        Mood and Affect: Mood normal.     Ortho Exam  Specialty Comments:  No specialty comments available.  Imaging: No results found.   PMFS History: There are no active problems to display for this patient.  Past Medical History:  Diagnosis Date  . Hypothyroidism   . Lupus ([place])     Family History  Problem Relation Age of Onset  . Deep vein thrombosis Mother   . Diabetes Sister   . Kidney disease Sister   . Deep vein thrombosis Sister   . Breast cancer Neg Hx     Past Surgical History:  Procedure Laterality Date  . ABDOMINAL HYSTERECTOMY  age 6   [place]    . OOPHORECTOMY     BSO  . PELVIC LAPAROSCOPY     Social History   Occupational History  . Not on file  Tobacco Use  . Smoking status: Former Research scientist (life sciences)  . Smokeless tobacco: Never Used   Substance and Sexual Activity  . Alcohol use: Yes    Comment: Rare  . Drug use: No  . Sexual activity: Never    Comment: 1st intercourse 68 yo-Fewer than 5 partners

## 2018-12-27 ENCOUNTER — Other Ambulatory Visit: Payer: Self-pay | Admitting: Internal Medicine

## 2018-12-27 DIAGNOSIS — Z1231 Encounter for screening mammogram for malignant neoplasm of breast: Secondary | ICD-10-CM

## 2018-12-29 ENCOUNTER — Encounter: Payer: Self-pay | Admitting: Surgery

## 2018-12-29 ENCOUNTER — Ambulatory Visit (INDEPENDENT_AMBULATORY_CARE_PROVIDER_SITE_OTHER): Payer: Medicare Other | Admitting: Surgery

## 2018-12-29 ENCOUNTER — Other Ambulatory Visit: Payer: Self-pay

## 2018-12-29 DIAGNOSIS — M25512 Pain in left shoulder: Secondary | ICD-10-CM | POA: Diagnosis not present

## 2018-12-29 DIAGNOSIS — G8929 Other chronic pain: Secondary | ICD-10-CM

## 2018-12-29 NOTE — Progress Notes (Signed)
68 year old black female returns for recheck of her left shoulder pain.  States that she had some improvement of her pain after injection but not dramatic.  Continues to have pain when she lays on her left side.  Also bothered with overhead activity.  Exam Left shoulder good range of motion but with discomfort.  Positive impingement test.  Negative drop arm test.  Tender over the proximal biceps tendon.  Pain and weakness with supraspinatus resistance.  Plan Since patient has not had significant improvement with recent injection I recommend getting an MRI to rule out rotator cuff tear and other shoulder pathology.  Follow-up with Dr. Lorin Mercy after completion to discuss results and further treatment options.

## 2019-01-25 ENCOUNTER — Other Ambulatory Visit: Payer: Medicare Other

## 2019-01-31 ENCOUNTER — Ambulatory Visit: Payer: Medicare Other | Admitting: Orthopaedic Surgery

## 2019-02-16 ENCOUNTER — Other Ambulatory Visit: Payer: Self-pay

## 2019-02-16 ENCOUNTER — Ambulatory Visit
Admission: RE | Admit: 2019-02-16 | Discharge: 2019-02-16 | Disposition: A | Discharge status: Home / Self Care | Payer: Medicare Other | Source: Ambulatory Visit | Attending: Internal Medicine | Admitting: Internal Medicine

## 2019-02-16 DIAGNOSIS — Z1231 Encounter for screening mammogram for malignant neoplasm of breast: Secondary | ICD-10-CM

## 2019-02-19 ENCOUNTER — Ambulatory Visit: Payer: Medicare Other | Attending: Internal Medicine

## 2019-02-19 DIAGNOSIS — Z23 Encounter for immunization: Secondary | ICD-10-CM

## 2019-02-20 NOTE — Progress Notes (Signed)
   Covid-19 Vaccination Clinic  Name:  Isabel Lynn    MRN: [id number] DOB: March 03, 1950  02/19/2019  Ms. Isabel Lynn was observed post Covid-19 immunization for 15 minutes without incidence. She was provided with Vaccine Information Sheet and instruction to access the V-Safe system.   Ms. Isabel Lynn was instructed to call 911 with any severe reactions post vaccine: Marland Kitchen Difficulty breathing  . Swelling of your face and throat  . A fast heartbeat  . A bad rash all over your body  . Dizziness and weakness    Immunizations Administered    Name Date Dose VIS Date Route   Moderna COVID-19 Vaccine 02/19/2019  3:29 PM 0.5 mL 12/27/2018 Intramuscular   Manufacturer: Levan Hurst   Lot: [id number]   [id number]      Documented on behalf of: P. Joya Gaskins

## 2019-03-19 ENCOUNTER — Ambulatory Visit: Payer: Medicare Other | Attending: Internal Medicine

## 2019-03-19 DIAGNOSIS — Z23 Encounter for immunization: Secondary | ICD-10-CM | POA: Insufficient documentation

## 2019-03-19 NOTE — Progress Notes (Signed)
   Covid-19 Vaccination Clinic  Name:  Isabel Lynn    MRN: [id number] DOB: 11-08-1950  03/19/2019  Ms. Isabel Lynn was observed post Covid-19 immunization for 15 minutes without incidence. She was provided with Vaccine Information Sheet and instruction to access the V-Safe system.   Ms. Isabel Lynn was instructed to call 911 with any severe reactions post vaccine: Marland Kitchen Difficulty breathing  . Swelling of your face and throat  . A fast heartbeat  . A bad rash all over your body  . Dizziness and weakness    Immunizations Administered    Name Date Dose VIS Date Route   Moderna COVID-19 Vaccine 03/19/2019 11:59 AM 0.5 mL 12/27/2018 Intramuscular   Manufacturer: Moderna   Lot: [id number]   [id number]

## 2019-06-02 ENCOUNTER — Ambulatory Visit
Admission: RE | Admit: 2019-06-02 | Discharge: 2019-06-02 | Disposition: A | Discharge status: Home / Self Care | Payer: Medicare Other | Source: Ambulatory Visit | Attending: Surgery | Admitting: Surgery

## 2019-06-02 ENCOUNTER — Other Ambulatory Visit: Payer: Self-pay

## 2019-06-02 DIAGNOSIS — G8929 Other chronic pain: Secondary | ICD-10-CM

## 2019-06-02 DIAGNOSIS — M25512 Pain in left shoulder: Secondary | ICD-10-CM

## 2019-06-08 ENCOUNTER — Other Ambulatory Visit: Payer: Self-pay

## 2019-06-08 ENCOUNTER — Ambulatory Visit: Payer: Medicare Other | Admitting: Surgery

## 2019-06-08 ENCOUNTER — Encounter: Payer: Self-pay | Admitting: Surgery

## 2019-06-08 VITALS — BP 112/70 | HR 62 | Ht 63.0 in | Wt 146.0 lb

## 2019-06-08 DIAGNOSIS — M7542 Impingement syndrome of left shoulder: Secondary | ICD-10-CM

## 2019-06-08 DIAGNOSIS — M25512 Pain in left shoulder: Secondary | ICD-10-CM | POA: Diagnosis not present

## 2019-06-08 DIAGNOSIS — G8929 Other chronic pain: Secondary | ICD-10-CM

## 2019-06-08 NOTE — Progress Notes (Signed)
69 year old black female history of left shoulder pain comes in to review MRI scan that was performed Jun 02, 2019.  Scan showed:  IMPRESSION: 1. Moderate rotator cuff tendinopathy/tendinosis with interstitial tears and bursal and articular surface fraying and fibrillation. No partial or full-thickness rotator cuff tear. 2. Intact long head biceps tendon and glenoid labrum. 3. No significant findings for bony impingement. 4. Loculated appearing multi septated cystic structure in the subacromial/subdeltoid bursa could be a ganglion cyst or focal/complex bursitis.  She continues to have ongoing pain with overactivity and reaching on her back.  Pain when she lays direct on her left shoulder.  She failed conservative treatment with previous subacromial Marcaine/Depo-Medrol injection.  Plan I will have patient follow-up with Dr. Lorin Mercy next week again to review MRI scan and to discuss surgical options.  Briefly discussed the possibility of outpatient arthroscopy with debridement of the ganglion cyst and possible rotator cuff repair.  Dr. Lorin Mercy will discuss this further with her.  All questions answered.

## 2019-06-16 ENCOUNTER — Ambulatory Visit: Payer: Medicare Other | Admitting: Orthopaedic Surgery

## 2019-06-16 ENCOUNTER — Other Ambulatory Visit: Payer: Self-pay

## 2019-06-16 ENCOUNTER — Encounter: Payer: Self-pay | Admitting: Orthopaedic Surgery

## 2019-06-16 DIAGNOSIS — M7542 Impingement syndrome of left shoulder: Secondary | ICD-10-CM

## 2019-06-19 DIAGNOSIS — M7542 Impingement syndrome of left shoulder: Secondary | ICD-10-CM

## 2019-06-19 MED ORDER — BUPIVACAINE HCL 0.25 % IJ SOLN
4.0000 mL | INTRAMUSCULAR | Status: AC | PRN
  Administered 2019-06-19: 4 mL via INTRA_ARTICULAR

## 2019-06-19 MED ORDER — METHYLPREDNISOLONE ACETATE 40 MG/ML IJ SUSP
40.0000 mg | INTRAMUSCULAR | Status: AC | PRN
  Administered 2019-06-19: 40 mg via INTRA_ARTICULAR

## 2019-06-19 MED ORDER — LIDOCAINE HCL 1 % IJ SOLN
0.5000 mL | INTRAMUSCULAR | Status: AC | PRN
  Administered 2019-06-19: .5 mL

## 2019-06-19 NOTE — Progress Notes (Signed)
Office Visit Note   Patient: Isabel Lynn           Date of Birth: [birth year]           MRN: [id number] Visit Date: 06/16/2019              Requested by: Rogers Blocker, MD [address] [place],  [place] [address] PCP: Rogers Blocker, MD   Assessment & Plan: Visit Diagnoses:  1. Impingement syndrome of left shoulder     Plan: We discussed options and recommended subacromial aspiration of the cyst and multiple penetrations with intra-cyst cortisone injection which was performed.  Mild multiple passes were made and cortisone injected.  We will see how she does with this and she can return if she has persistent problems.  We discussed arthroscopic evaluation options if she has persistent symptoms.  Follow-Up Instructions: No follow-ups on file.   Orders:  No orders of the defined types were placed in this encounter.  No orders of the defined types were placed in this encounter.     Procedures: Large Joint Inj: L subacromial bursa on 06/19/2019 2:26 PM Indications: pain Details: 22 G 1.5 in needle  Arthrogram: No  Medications: 4 mL bupivacaine 0.25 %; 40 mg methylPREDNISolone acetate 40 MG/ML; 0.5 mL lidocaine 1 % Outcome: tolerated well, no immediate complications Procedure, treatment alternatives, risks and benefits explained, specific risks discussed. Consent was given by the patient. Immediately prior to procedure a time out was called to verify the correct patient, procedure, equipment, support staff and site/side marked as required. Patient was prepped and draped in the usual sterile fashion.       Clinical Data: No additional findings.   Subjective: Chief Complaint  Patient presents with  . Left Shoulder - Pain    MRI Review    HPI 69 year old female returns with ongoing problems with left shoulder pain.  MRI scan has been obtained of her shoulder which showed subacromial bursitis versus ganglion with multiseptated subacromial cystic structure.  Patient  did have some intersubstance supraspinatus tendinopathy but no discrete partial or full-thickness tear was present.  She continues to have pain difficulty using her arm particularly with outstretched overhead activities.  Review of Systems 14 point review of systems updated unchanged from 06/08/2019 office visit other than as mentioned HPI.   Objective: Vital Signs: BP 110/72   Pulse (!) 55   Ht 5\' 3"  (1.6 m)   Wt 153 lb (69.4 kg)   BMI 27.10 kg/m   Physical Exam Constitutional:      Appearance: She is well-developed.  HENT:     Head: Normocephalic.     Right Ear: External ear normal.     Left Ear: External ear normal.  Eyes:     Pupils: Pupils are equal, round, and reactive to light.  Neck:     Thyroid: No thyromegaly.     Trachea: No tracheal deviation.  Cardiovascular:     Rate and Rhythm: Normal rate.  Pulmonary:     Effort: Pulmonary effort is normal.  Abdominal:     Palpations: Abdomen is soft.  Skin:    General: Skin is warm and dry.  Neurological:     Mental Status: She is alert and oriented to person, place, and time.  Psychiatric:        Behavior: Behavior normal.     Ortho Exam patient can use her arm up overhead with some discomfort.  Positive impingement negative drop arm test negative subluxation  negative speeds.  Long head of the biceps minimally tender negative Yergason upper extremity reflexes 2+ and symmetrical. Specialty Comments:  No specialty comments available.  Imaging: No results found.   PMFS History: Patient Active Problem List   Diagnosis Date Noted  . Impingement syndrome of left shoulder 06/19/2019   Past Medical History:  Diagnosis Date  . Hypothyroidism   . Lupus ([place])     Family History  Problem Relation Age of Onset  . Deep vein thrombosis Mother   . Diabetes Sister   . Kidney disease Sister   . Deep vein thrombosis Sister   . Breast cancer Neg Hx     Past Surgical History:  Procedure Laterality Date  . ABDOMINAL  HYSTERECTOMY  age 89   [place]    . OOPHORECTOMY     BSO  . PELVIC LAPAROSCOPY     Social History   Occupational History  . Not on file  Tobacco Use  . Smoking status: Former Research scientist (life sciences)  . Smokeless tobacco: Never Used  Substance and Sexual Activity  . Alcohol use: Yes    Comment: Rare  . Drug use: No  . Sexual activity: Never    Comment: 1st intercourse 69 yo-Fewer than 5 partners

## 2020-01-25 ENCOUNTER — Other Ambulatory Visit: Payer: Medicare Other

## 2020-01-25 DIAGNOSIS — Z20822 Contact with and (suspected) exposure to covid-19: Secondary | ICD-10-CM

## 2020-01-27 LAB — SARS-COV-2, NAA 2 DAY TAT

## 2020-01-27 LAB — NOVEL CORONAVIRUS, NAA: SARS-CoV-2, NAA: NOT DETECTED

## 2020-03-07 ENCOUNTER — Other Ambulatory Visit: Payer: Self-pay | Admitting: Internal Medicine

## 2020-03-07 DIAGNOSIS — Z1231 Encounter for screening mammogram for malignant neoplasm of breast: Secondary | ICD-10-CM

## 2020-04-26 ENCOUNTER — Ambulatory Visit: Payer: Medicare Other

## 2020-06-05 ENCOUNTER — Telehealth: Payer: Self-pay

## 2020-06-13 NOTE — Telephone Encounter (Signed)
error 

## 2020-07-31 ENCOUNTER — Telehealth: Payer: Self-pay | Admitting: Radiology

## 2020-07-31 NOTE — Telephone Encounter (Signed)
I called patient per Benjiman Core, PA-C request. Patient is on his schedule for shoulder injection tomorrow, however, he states that the last time he saw her he wanted her to follow up with Dr. Lorin Mercy to discuss surgery. He is not going to repeat injection tomorrow, however, will see patient if she still wishes to come in. Patient states that Dr. Lorin Mercy did not want to do surgery and told her to come back for repeat injection if the shoulder continued to bother her. Appt with Jeneen Rinks canceled and rescheduled to Dr. Lorin Mercy on 7/12.

## 2020-08-01 ENCOUNTER — Ambulatory Visit: Payer: Medicare HMO | Admitting: Surgery

## 2020-08-06 ENCOUNTER — Other Ambulatory Visit: Payer: Self-pay

## 2020-08-06 ENCOUNTER — Ambulatory Visit: Payer: Medicare HMO | Admitting: Orthopaedic Surgery

## 2020-08-06 ENCOUNTER — Encounter: Payer: Self-pay | Admitting: Orthopaedic Surgery

## 2020-08-06 VITALS — Ht 63.0 in | Wt 140.0 lb

## 2020-08-06 DIAGNOSIS — M7542 Impingement syndrome of left shoulder: Secondary | ICD-10-CM

## 2020-08-08 DIAGNOSIS — M7542 Impingement syndrome of left shoulder: Secondary | ICD-10-CM | POA: Diagnosis not present

## 2020-08-08 MED ORDER — BUPIVACAINE HCL 0.25 % IJ SOLN
4.0000 mL | INTRAMUSCULAR | Status: AC | PRN
  Administered 2020-08-08: 4 mL via INTRA_ARTICULAR

## 2020-08-08 MED ORDER — LIDOCAINE HCL 1 % IJ SOLN
0.5000 mL | INTRAMUSCULAR | Status: AC | PRN
Start: 2020-08-08 — End: 2020-08-08
  Administered 2020-08-08: .5 mL

## 2020-08-08 MED ORDER — METHYLPREDNISOLONE ACETATE 40 MG/ML IJ SUSP
40.0000 mg | INTRAMUSCULAR | Status: AC | PRN
  Administered 2020-08-08: 40 mg via INTRA_ARTICULAR

## 2020-08-08 NOTE — Progress Notes (Signed)
Office Visit Note   Patient: Isabel Lynn           Date of Birth: 02/16/1950           MRN: [id number] Visit Date: 08/06/2020              Requested by: Rogers Blocker, MD [address] [place],   [id number] PCP: Rogers Blocker, MD   Assessment & Plan: Visit Diagnoses:  1. Impingement syndrome of left shoulder     Plan: Subacromial injection performed with good pain relief.  We discussed modification of her workout activities with weights with exercises that decreased supraspinatus tendon loading.  Alternative exercises with less overhead positioning discussed in detail.  Follow-up as needed.  Follow-Up Instructions: No follow-ups on file.   Orders:  Orders Placed This Encounter  Procedures   Large Joint Inj   No orders of the defined types were placed in this encounter.     Procedures: Large Joint Inj: L subacromial bursa on 08/08/2020 9:23 AM Indications: pain Details: 22 G 1.5 in needle  Arthrogram: No  Medications: 4 mL bupivacaine 0.25 %; 40 mg methylPREDNISolone acetate 40 MG/ML; 0.5 mL lidocaine 1 % Outcome: tolerated well, no immediate complications Procedure, treatment alternatives, risks and benefits explained, specific risks discussed. Consent was given by the patient. Immediately prior to procedure a time out was called to verify the correct patient, procedure, equipment, support staff and site/side marked as required. Patient was prepped and draped in the usual sterile fashion.      Clinical Data: No additional findings.   Subjective: Chief Complaint  Patient presents with   Left Shoulder - Pain    HPI 70 year old female here with recurrent left shoulder pain.  Previous injection 06/16/2019 with good relief for a year.  She has trouble sleeping at night pain with outstretch reaching.  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  She still able get her arm up overhead.  She  uses free weights.  Review of Systems 14 point system update unchanged from 06/16/2019 visit.   Objective: Vital Signs: Ht 5\' 3"  (1.6 m)   Wt 140 lb (63.5 kg)   BMI 24.80 kg/m   Physical Exam Constitutional:      Appearance: She is well-developed.  HENT:     Head: Normocephalic.     Right Ear: External ear normal.     Left Ear: External ear normal. There is no impacted cerumen.  Eyes:     Pupils: Pupils are equal, round, and reactive to light.  Neck:     Thyroid: No thyromegaly.     Trachea: No tracheal deviation.  Cardiovascular:     Rate and Rhythm: Normal rate.  Pulmonary:     Effort: Pulmonary effort is normal.  Abdominal:     Palpations: Abdomen is soft.  Musculoskeletal:     Cervical back: No rigidity.  Skin:    General: Skin is warm and dry.  Neurological:     Mental Status: She is alert and oriented to person, place, and time.  Psychiatric:        Behavior: Behavior normal.    Ortho Exam prominent upper extremities bilateral symmetrical muscle involvement.  Long head of biceps normal positive impingement negative drop arm test.  No thenar or hypothenar atrophy.  Specialty Comments:  No specialty comments available.  Imaging: No results found.   PMFS History: Patient Active Problem  List   Diagnosis Date Noted   Impingement syndrome of left shoulder 06/19/2019   Past Medical History:  Diagnosis Date   Hypothyroidism    Lupus ([place])     Family History  Problem Relation Age of Onset   Deep vein thrombosis Mother    Diabetes Sister    Kidney disease Sister    Deep vein thrombosis Sister    Breast cancer Neg Hx     Past Surgical History:  Procedure Laterality Date   ABDOMINAL HYSTERECTOMY  age 89   BSO   KNEE SURGERY     OOPHORECTOMY     BSO   PELVIC LAPAROSCOPY     Social History   Occupational History   Not on file  Tobacco Use   Smoking status: Former   Smokeless tobacco: Never  Scientific laboratory technician Use: Never used  Substance and  Sexual Activity   Alcohol use: Yes    Comment: Rare   Drug use: No   Sexual activity: Never    Comment: 1st intercourse 70 yo-Fewer than 5 partners

## 2020-08-12 ENCOUNTER — Ambulatory Visit
Admission: RE | Admit: 2020-08-12 | Discharge: 2020-08-12 | Disposition: A | Discharge status: Home / Self Care | Payer: Medicare HMO | Source: Ambulatory Visit | Attending: Internal Medicine | Admitting: Internal Medicine

## 2020-08-12 ENCOUNTER — Other Ambulatory Visit: Payer: Self-pay

## 2020-08-12 DIAGNOSIS — Z1231 Encounter for screening mammogram for malignant neoplasm of breast: Secondary | ICD-10-CM

## 2020-08-15 ENCOUNTER — Other Ambulatory Visit: Payer: Self-pay | Admitting: Internal Medicine

## 2020-08-15 DIAGNOSIS — R928 Other abnormal and inconclusive findings on diagnostic imaging of breast: Secondary | ICD-10-CM

## 2020-09-02 ENCOUNTER — Ambulatory Visit
Admission: RE | Admit: 2020-09-02 | Discharge: 2020-09-02 | Disposition: A | Discharge status: Home / Self Care | Payer: Medicare HMO | Source: Ambulatory Visit | Attending: Internal Medicine | Admitting: Internal Medicine

## 2020-09-02 ENCOUNTER — Other Ambulatory Visit: Payer: Self-pay | Admitting: Internal Medicine

## 2020-09-02 ENCOUNTER — Other Ambulatory Visit: Payer: Self-pay

## 2020-09-02 DIAGNOSIS — R928 Other abnormal and inconclusive findings on diagnostic imaging of breast: Secondary | ICD-10-CM

## 2020-09-02 DIAGNOSIS — N6489 Other specified disorders of breast: Secondary | ICD-10-CM

## 2020-09-16 ENCOUNTER — Ambulatory Visit
Admission: RE | Admit: 2020-09-16 | Discharge: 2020-09-16 | Disposition: A | Discharge status: Home / Self Care | Payer: Medicare HMO | Source: Ambulatory Visit | Attending: Internal Medicine | Admitting: Internal Medicine

## 2020-09-16 ENCOUNTER — Other Ambulatory Visit: Payer: Self-pay

## 2020-09-16 DIAGNOSIS — N6489 Other specified disorders of breast: Secondary | ICD-10-CM

## 2020-09-16 HISTORY — PX: BREAST BIOPSY: SHX20

## 2021-02-20 ENCOUNTER — Other Ambulatory Visit: Payer: Self-pay | Admitting: Internal Medicine

## 2021-02-20 DIAGNOSIS — D241 Benign neoplasm of right breast: Secondary | ICD-10-CM

## 2021-02-20 DIAGNOSIS — R921 Mammographic calcification found on diagnostic imaging of breast: Secondary | ICD-10-CM

## 2021-03-04 ENCOUNTER — Ambulatory Visit: Payer: Medicare HMO | Admitting: Orthopaedic Surgery

## 2021-03-04 ENCOUNTER — Ambulatory Visit (INDEPENDENT_AMBULATORY_CARE_PROVIDER_SITE_OTHER): Payer: Medicare HMO

## 2021-03-04 ENCOUNTER — Other Ambulatory Visit: Payer: Self-pay

## 2021-03-04 ENCOUNTER — Encounter: Payer: Self-pay | Admitting: Orthopaedic Surgery

## 2021-03-04 VITALS — BP 110/65 | Ht 63.0 in | Wt 136.0 lb

## 2021-03-04 DIAGNOSIS — M25561 Pain in right knee: Secondary | ICD-10-CM

## 2021-03-04 DIAGNOSIS — M1711 Unilateral primary osteoarthritis, right knee: Secondary | ICD-10-CM

## 2021-03-04 NOTE — Progress Notes (Signed)
Office Visit Note   Patient: Isabel Lynn           Date of Birth: 1950-02-25           MRN: [id number] Visit Date: 03/04/2021              Requested by: Rogers Blocker, MD [address] [place],  [place] [id number] PCP: Rogers Blocker, MD   Assessment & Plan: Visit Diagnoses:  1. Acute pain of right knee   2. Unilateral primary osteoarthritis, right knee     Plan: Knee injection performed.  Postinjection she was walking better.  X-ray results were reviewed.  She will return if she is having increasing symptoms.  Follow-Up Instructions: No follow-ups on file.   Orders:  Orders Placed This Encounter  Procedures   Large Joint Inj: R knee   XR KNEE 3 VIEW RIGHT   No orders of the defined types were placed in this encounter.     Procedures: Large Joint Inj: R knee on 03/04/2021 11:22 AM Indications: pain and joint swelling Details: 22 G 1.5 in needle, anterolateral approach  Arthrogram: No  Medications: 40 mg methylPREDNISolone acetate 40 MG/ML; 0.5 mL lidocaine 1 %; 4 mL bupivacaine 0.25 % Outcome: tolerated well, no immediate complications Procedure, treatment alternatives, risks and benefits explained, specific risks discussed. Consent was given by the patient. Immediately prior to procedure a time out was called to verify the correct patient, procedure, equipment, support staff and site/side marked as required. Patient was prepped and draped in the usual sterile fashion.      Clinical Data: No additional findings.   Subjective: Chief Complaint  Patient presents with   Right Knee - Pain    HPI 71 year old female cleaning a house 2 weeks ago turned and twisted her knee with sharp pain.  She states since that time she can barely go up and down the stairs.  She feels sharp pain with activity.  She has used ice but is still having considerable problems walking .  Review of Systems negative for rheumatologic conditions no fever chills.  No bowel  bladder symptoms.  14 point systems otherwise negative.   Objective: Vital Signs: BP 110/65    Ht 5\' 3"  (1.6 m)    Wt 136 lb (61.7 kg)    BMI 24.09 kg/m   Physical Exam Constitutional:      Appearance: She is well-developed.  HENT:     Head: Normocephalic.     Right Ear: External ear normal.     Left Ear: External ear normal. There is no impacted cerumen.  Eyes:     Pupils: Pupils are equal, round, and reactive to light.  Neck:     Thyroid: No thyromegaly.     Trachea: No tracheal deviation.  Cardiovascular:     Rate and Rhythm: Normal rate.  Pulmonary:     Effort: Pulmonary effort is normal.  Abdominal:     Palpations: Abdomen is soft.  Musculoskeletal:     Cervical back: No rigidity.  Skin:    General: Skin is warm and dry.  Neurological:     Mental Status: She is alert and oriented to person, place, and time.  Psychiatric:        Behavior: Behavior normal.    Ortho Exam patient has crepitus with range of motion trace effusion.  Collateral ligaments are stable tender medial lateral joint line no palpable Baker's cyst distal pulses are 2+ negative logroll the hips no  sciatic notch tenderness no pitting edema no rash over exposed skin.  Specialty Comments:  No specialty comments available.  Imaging: Standing AP both knees lateral right knee sunrise patella x-ray obtained this shows moderate tricompartmental degenerative arthritis with mild genu valgum right knee.  There is mild to moderate arthritic changes in the left knee. Marginal osteophyte subchondral sclerosis and femoral condyle flattening is noted.  Impression: Moderate right knee osteoarthritis, tricompartmental.   PMFS History: Patient Active Problem List   Diagnosis Date Noted   Unilateral primary osteoarthritis, right knee 03/06/2021   Impingement syndrome of left shoulder 06/19/2019   Past Medical History:  Diagnosis Date   Hypothyroidism    Lupus ([place])     Family History  Problem Relation Age  of Onset   Deep vein thrombosis Mother    Diabetes Sister    Kidney disease Sister    Deep vein thrombosis Sister    Breast cancer Neg Hx     Past Surgical History:  Procedure Laterality Date   ABDOMINAL HYSTERECTOMY  age 65   BSO   KNEE SURGERY     OOPHORECTOMY     BSO   PELVIC LAPAROSCOPY     Social History   Occupational History   Not on file  Tobacco Use   Smoking status: Former   Smokeless tobacco: Never  Vaping Use   Vaping Use: Never used  Substance and Sexual Activity   Alcohol use: Yes    Comment: Rare   Drug use: No   Sexual activity: Never    Comment: 1st intercourse 71 yo-Fewer than 5 partners

## 2021-03-06 DIAGNOSIS — M1711 Unilateral primary osteoarthritis, right knee: Secondary | ICD-10-CM | POA: Insufficient documentation

## 2021-03-06 MED ORDER — LIDOCAINE HCL 1 % IJ SOLN
0.5000 mL | INTRAMUSCULAR | Status: AC | PRN
  Administered 2021-03-04: .5 mL

## 2021-03-06 MED ORDER — BUPIVACAINE HCL 0.25 % IJ SOLN
4.0000 mL | INTRAMUSCULAR | Status: AC | PRN
  Administered 2021-03-04: 4 mL via INTRA_ARTICULAR

## 2021-03-06 MED ORDER — METHYLPREDNISOLONE ACETATE 40 MG/ML IJ SUSP
40.0000 mg | INTRAMUSCULAR | Status: AC | PRN
  Administered 2021-03-04: 40 mg via INTRA_ARTICULAR

## 2021-03-26 ENCOUNTER — Ambulatory Visit
Admission: RE | Admit: 2021-03-26 | Discharge: 2021-03-26 | Disposition: A | Discharge status: Home / Self Care | Payer: Medicare HMO | Source: Ambulatory Visit | Attending: Internal Medicine | Admitting: Internal Medicine

## 2021-03-26 ENCOUNTER — Ambulatory Visit: Payer: Medicare HMO

## 2021-03-26 DIAGNOSIS — R921 Mammographic calcification found on diagnostic imaging of breast: Secondary | ICD-10-CM

## 2021-03-26 DIAGNOSIS — D241 Benign neoplasm of right breast: Secondary | ICD-10-CM

## 2021-06-03 ENCOUNTER — Encounter: Payer: Self-pay | Admitting: Orthopaedic Surgery

## 2021-06-03 ENCOUNTER — Ambulatory Visit: Payer: Medicare HMO | Admitting: Orthopaedic Surgery

## 2021-06-03 VITALS — BP 114/62 | HR 58 | Ht 63.0 in | Wt 143.0 lb

## 2021-06-03 DIAGNOSIS — M1711 Unilateral primary osteoarthritis, right knee: Secondary | ICD-10-CM | POA: Diagnosis not present

## 2021-06-03 NOTE — Progress Notes (Signed)
? ?Office Visit Note ?  ?Patient: Isabel Lynn           ?Date of Birth: 22-Jan-1951           ?MRN: [id number] ?Visit Date: 06/03/2021 ?             ?Requested by: Rogers Blocker, MD ?[address] ?[address] ?[place],  [place] [phone] ?PCP: Rogers Blocker, MD ? ? ?Assessment & Plan: ?Visit Diagnoses:  ?1. Unilateral primary osteoarthritis, right knee   ? ? ?Plan: Good relief post right knee injection in February.  We reviewed x-rays again discussed pathophysiology of osteoarthritis.  She can return when she has increased symptoms. ? ?Follow-Up Instructions: No follow-ups on file.  ? ?Orders:  ?No orders of the defined types were placed in this encounter. ? ?No orders of the defined types were placed in this encounter. ? ? ? ? Procedures: ?No procedures performed ? ? ?Clinical Data: ?No additional findings. ? ? ?Subjective: ?Chief Complaint  ?Patient presents with  ? Right Knee - Follow-up  ? ? ?HPI 71 year old female follow-up moderate to moderate severe right knee osteoarthritis.  Knee injection 03/04/2021.  She does well riding a bike she states if she runs much she has increased pain and some swelling at the end of the day.  Overall it improved from preinjection in February.  Patient continues to work out regularly for fitness. ? ?Review of Systems updated unchanged from 03/04/2021. ? ? ?Objective: ?Vital Signs: BP 114/62   Pulse (!) 58   Ht '5\' 3"'$  (1.6 m)   Wt 143 lb (64.9 kg)   BMI 25.33 kg/m?  ? ?Physical Exam ?Constitutional:   ?   Appearance: She is well-developed.  ?HENT:  ?   Head: Normocephalic.  ?   Right Ear: External ear normal.  ?   Left Ear: External ear normal. There is no impacted cerumen.  ?Eyes:  ?   Pupils: Pupils are equal, round, and reactive to light.  ?Neck:  ?   Thyroid: No thyromegaly.  ?   Trachea: No tracheal deviation.  ?Cardiovascular:  ?   Rate and Rhythm: Normal rate.  ?Pulmonary:  ?   Effort: Pulmonary effort is normal.  ?Abdominal:  ?   Palpations: Abdomen is soft.   ?Musculoskeletal:  ?   Cervical back: No rigidity.  ?Skin: ?   General: Skin is warm and dry.  ?Neurological:  ?   Mental Status: She is alert and oriented to person, place, and time.  ?Psychiatric:     ?   Behavior: Behavior normal.  ? ? ?Ortho Exam patient has trace knee effusion collateral ligaments are stable palpable lateral osteophytes.  Patellofemoral crepitus.  She can walk rapidly across the floor to from sitting standing without pain or discomfort.  Patient can heel and toe walk negative logroll the hips. ? ?Specialty Comments:  ?No specialty comments available. ? ?Imaging: ?No results found. ? ? ?PMFS History: ?Patient Active Problem List  ? Diagnosis Date Noted  ? Unilateral primary osteoarthritis, right knee 03/06/2021  ? Impingement syndrome of left shoulder 06/19/2019  ? ?Past Medical History:  ?Diagnosis Date  ? Hypothyroidism   ? Lupus ([place])   ?  ?Family History  ?Problem Relation Age of Onset  ? Deep vein thrombosis Mother   ? Diabetes Sister   ? Kidney disease Sister   ? Deep vein thrombosis Sister   ? Breast cancer Neg Hx   ?  ?Past Surgical History:  ?Procedure  Laterality Date  ? ABDOMINAL HYSTERECTOMY  age 29  ? BSO  ? BREAST BIOPSY Right 09/16/2020  ? x2  ? KNEE SURGERY    ? OOPHORECTOMY    ? BSO  ? PELVIC LAPAROSCOPY    ? ?Social History  ? ?Occupational History  ? Not on file  ?Tobacco Use  ? Smoking status: Former  ? Smokeless tobacco: Never  ?Vaping Use  ? Vaping Use: Never used  ?Substance and Sexual Activity  ? Alcohol use: Yes  ?  Comment: Rare  ? Drug use: No  ? Sexual activity: Never  ?  Comment: 1st intercourse 71 yo-Fewer than 5 partners  ? ? ? ? ? ? ?

## 2021-09-26 ENCOUNTER — Other Ambulatory Visit: Payer: Self-pay | Admitting: Internal Medicine

## 2021-09-26 DIAGNOSIS — Z1231 Encounter for screening mammogram for malignant neoplasm of breast: Secondary | ICD-10-CM

## 2021-10-08 ENCOUNTER — Ambulatory Visit: Payer: Medicare HMO

## 2021-10-31 ENCOUNTER — Ambulatory Visit
Admission: RE | Admit: 2021-10-31 | Discharge: 2021-10-31 | Disposition: A | Discharge status: Home / Self Care | Payer: Medicare HMO | Source: Ambulatory Visit | Attending: Internal Medicine | Admitting: Internal Medicine

## 2021-10-31 DIAGNOSIS — Z1231 Encounter for screening mammogram for malignant neoplasm of breast: Secondary | ICD-10-CM

## 2022-08-01 IMAGING — MG MM DIGITAL DIAGNOSTIC UNILAT*R* W/ TOMO W/ CAD
4 series · 4 of 12 positions shown · non-contrast
Comparison: Previous exam(s).

CLINICAL DATA: Six-month follow-up after benign biopsies in the
RIGHT breast. Patient had ultrasound-guided core biopsy of mass in
the 7 o'clock location of the RIGHT breast showing fibrocystic
changes. Biopsy of mass in the 6 o'clock location showed
fibroadenoma with apocrine metaplasia and calcifications. Both
biopsies were felt to be concordant with imaging findings.

EXAM:
DIGITAL DIAGNOSTIC UNILATERAL RIGHT MAMMOGRAM WITH TOMOSYNTHESIS AND
CAD
TECHNIQUE: Right digital diagnostic mammography and breast tomosynthesis was
performed. The images were evaluated with computer-aided detection.

[R MLO synth-2D]
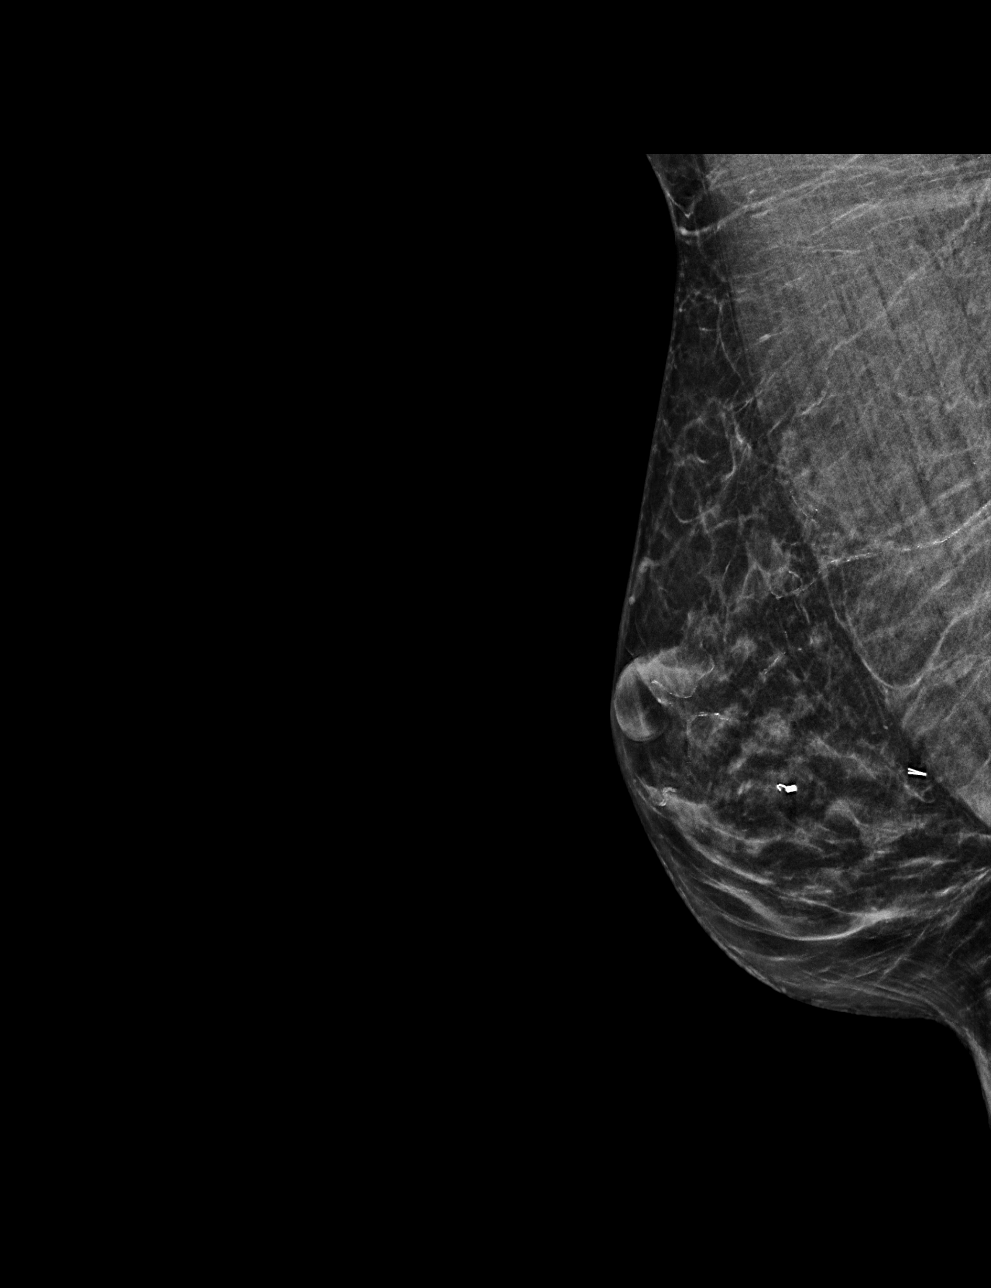

[R CC synth-2D]
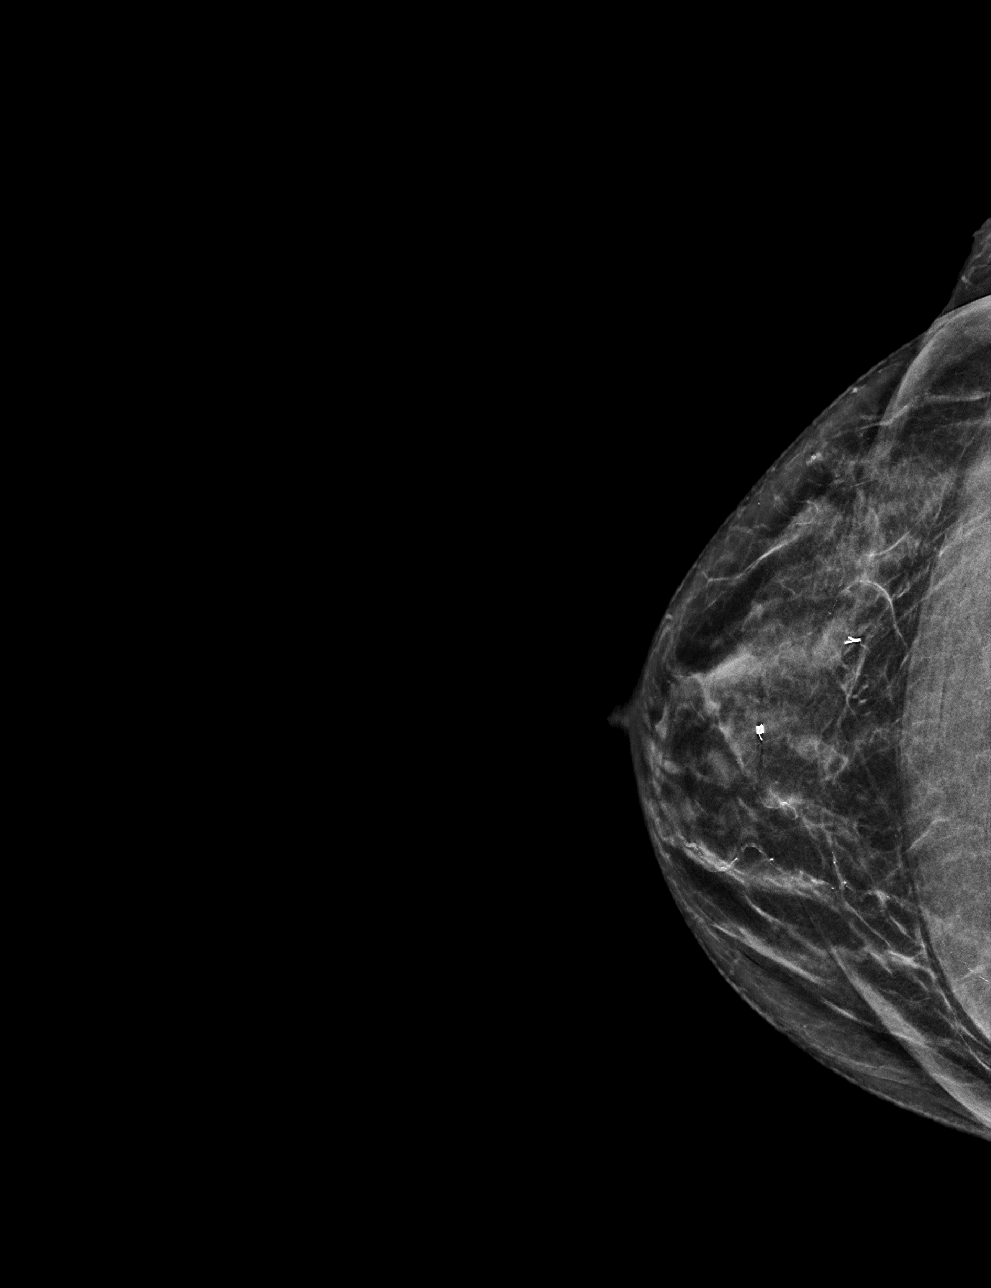

[R CC tomo · tomo slice 21/42.0]
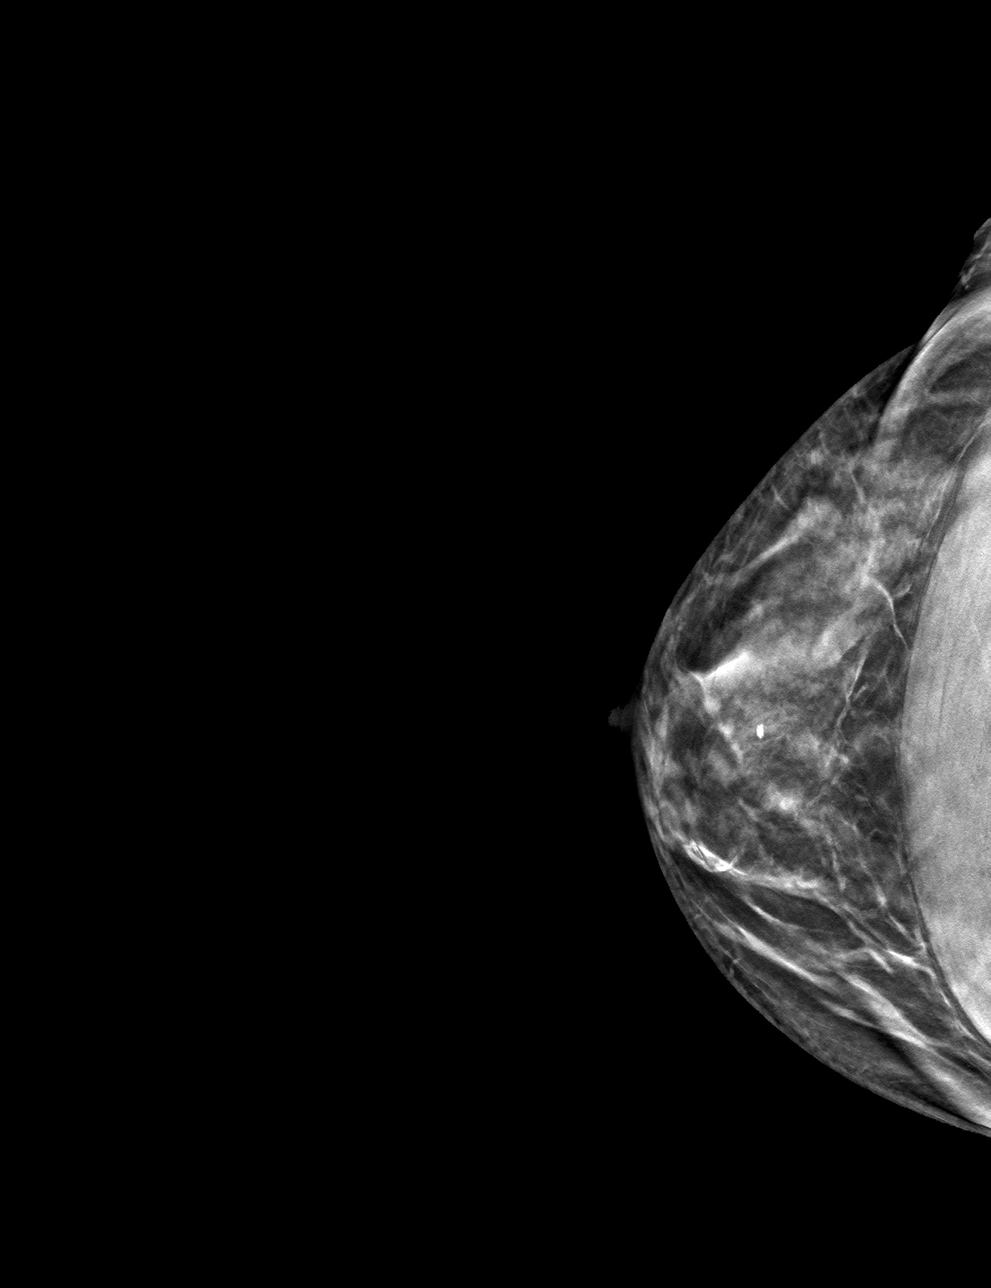

[R MLO tomo · tomo slice 21/40.0]
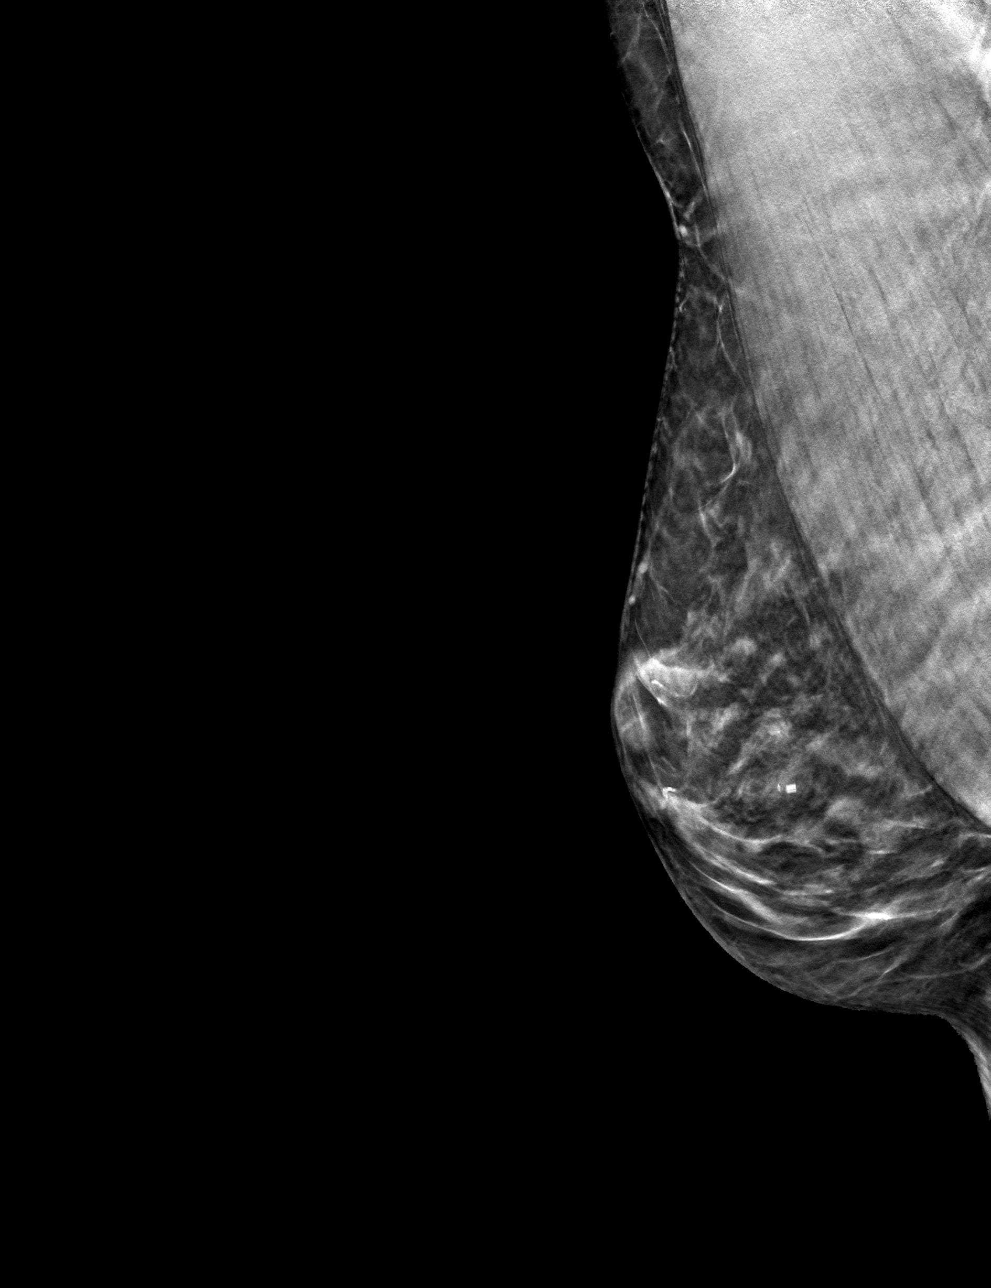

[4 of 12 positions shown; findings below may reference images not displayed]

ACR Breast Density Category c: The breast tissue is heterogeneously
dense, which may obscure small masses.
FINDINGS: No suspicious mass, distortion, or microcalcifications are
identified to suggest presence of malignancy. Tissue marker clips
are identified in the LOWER central RIGHT breast from previous
benign biopsies.
IMPRESSION: No mammographic evidence for malignancy.

RECOMMENDATION:
Recommend screening mammogram in July 2021.

I have discussed the findings and recommendations with the patient.
If applicable, a reminder letter will be sent to the patient
regarding the next appointment.

BI-RADS CATEGORY  2: Benign.

## 2022-12-09 ENCOUNTER — Other Ambulatory Visit: Payer: Self-pay | Admitting: Internal Medicine

## 2022-12-09 DIAGNOSIS — Z1231 Encounter for screening mammogram for malignant neoplasm of breast: Secondary | ICD-10-CM

## 2023-01-08 ENCOUNTER — Ambulatory Visit
Admission: RE | Admit: 2023-01-08 | Discharge: 2023-01-08 | Disposition: A | Discharge status: Home / Self Care | Payer: Medicare HMO | Source: Ambulatory Visit | Attending: Internal Medicine | Admitting: Internal Medicine

## 2023-01-08 DIAGNOSIS — Z1231 Encounter for screening mammogram for malignant neoplasm of breast: Secondary | ICD-10-CM

## 2023-07-23 DIAGNOSIS — Z79890 Hormone replacement therapy (postmenopausal): Secondary | ICD-10-CM | POA: Diagnosis not present

## 2023-07-23 DIAGNOSIS — Z8249 Family history of ischemic heart disease and other diseases of the circulatory system: Secondary | ICD-10-CM | POA: Diagnosis not present

## 2023-07-23 DIAGNOSIS — J301 Allergic rhinitis due to pollen: Secondary | ICD-10-CM | POA: Diagnosis not present

## 2023-07-23 DIAGNOSIS — E785 Hyperlipidemia, unspecified: Secondary | ICD-10-CM | POA: Diagnosis not present

## 2023-07-23 DIAGNOSIS — R030 Elevated blood-pressure reading, without diagnosis of hypertension: Secondary | ICD-10-CM | POA: Diagnosis not present

## 2023-07-23 DIAGNOSIS — M1990 Unspecified osteoarthritis, unspecified site: Secondary | ICD-10-CM | POA: Diagnosis not present

## 2023-07-23 DIAGNOSIS — R7303 Prediabetes: Secondary | ICD-10-CM | POA: Diagnosis not present

## 2023-07-23 DIAGNOSIS — E039 Hypothyroidism, unspecified: Secondary | ICD-10-CM | POA: Diagnosis not present

## 2023-11-01 DIAGNOSIS — H524 Presbyopia: Secondary | ICD-10-CM | POA: Diagnosis not present

## 2024-01-24 ENCOUNTER — Other Ambulatory Visit: Payer: Self-pay | Admitting: Internal Medicine

## 2024-01-24 DIAGNOSIS — Z1231 Encounter for screening mammogram for malignant neoplasm of breast: Secondary | ICD-10-CM

## 2024-02-01 ENCOUNTER — Ambulatory Visit: Admission: RE | Admit: 2024-02-01 | Discharge: 2024-02-01 | Disposition: A | Source: Ambulatory Visit

## 2024-02-01 DIAGNOSIS — Z1231 Encounter for screening mammogram for malignant neoplasm of breast: Secondary | ICD-10-CM
